# Patient Record
Sex: Female | Born: 1935 | Race: White | Hispanic: No | Smoking: Former smoker
Health system: Southern US, Community
[De-identification: ages and names within clinical notes are randomized; demographics above are authoritative.]

## PROBLEM LIST (undated history)

## (undated) DIAGNOSIS — F419 Anxiety disorder, unspecified: Secondary | ICD-10-CM

## (undated) DIAGNOSIS — E0590 Thyrotoxicosis, unspecified without thyrotoxic crisis or storm: Secondary | ICD-10-CM

## (undated) DIAGNOSIS — I10 Essential (primary) hypertension: Secondary | ICD-10-CM

## (undated) DIAGNOSIS — M1990 Unspecified osteoarthritis, unspecified site: Secondary | ICD-10-CM

## (undated) DIAGNOSIS — M549 Dorsalgia, unspecified: Secondary | ICD-10-CM

## (undated) DIAGNOSIS — I739 Peripheral vascular disease, unspecified: Secondary | ICD-10-CM

## (undated) DIAGNOSIS — C801 Malignant (primary) neoplasm, unspecified: Secondary | ICD-10-CM

## (undated) DIAGNOSIS — J449 Chronic obstructive pulmonary disease, unspecified: Secondary | ICD-10-CM

## (undated) DIAGNOSIS — R42 Dizziness and giddiness: Secondary | ICD-10-CM

## (undated) DIAGNOSIS — R011 Cardiac murmur, unspecified: Secondary | ICD-10-CM

## (undated) HISTORY — PX: MOHS SURGERY: SUR867

## (undated) HISTORY — PX: OTHER SURGICAL HISTORY: SHX169

## (undated) HISTORY — DX: Anxiety disorder, unspecified: F41.9

## (undated) HISTORY — DX: Cardiac murmur, unspecified: R01.1

## (undated) HISTORY — PX: ABDOMINAL HYSTERECTOMY: SHX81

---

## 2003-07-16 ENCOUNTER — Ambulatory Visit (HOSPITAL_COMMUNITY): Admission: RE | Admit: 2003-07-16 | Discharge: 2003-07-16 | Payer: Self-pay | Admitting: Interventional Cardiology

## 2003-10-29 ENCOUNTER — Ambulatory Visit: Payer: Self-pay | Admitting: Internal Medicine

## 2005-01-06 ENCOUNTER — Ambulatory Visit: Payer: Self-pay | Admitting: Internal Medicine

## 2006-04-27 ENCOUNTER — Ambulatory Visit: Payer: Self-pay | Admitting: Internal Medicine

## 2007-04-20 DIAGNOSIS — D039 Melanoma in situ, unspecified: Secondary | ICD-10-CM | POA: Insufficient documentation

## 2007-06-28 ENCOUNTER — Ambulatory Visit: Payer: Self-pay | Admitting: Family Medicine

## 2007-11-02 ENCOUNTER — Ambulatory Visit: Payer: Self-pay | Admitting: Rheumatology

## 2008-08-03 ENCOUNTER — Ambulatory Visit: Payer: Self-pay | Admitting: Family Medicine

## 2008-10-19 ENCOUNTER — Ambulatory Visit: Payer: Self-pay | Admitting: Gynecologic Oncology

## 2008-10-23 ENCOUNTER — Ambulatory Visit: Payer: Self-pay | Admitting: Gynecologic Oncology

## 2008-11-19 ENCOUNTER — Ambulatory Visit: Payer: Self-pay | Admitting: Gynecologic Oncology

## 2008-11-20 ENCOUNTER — Ambulatory Visit: Payer: Self-pay | Admitting: Gynecologic Oncology

## 2008-11-20 ENCOUNTER — Ambulatory Visit: Payer: Self-pay | Admitting: Cardiology

## 2008-11-27 ENCOUNTER — Inpatient Hospital Stay: Payer: Self-pay | Admitting: Gynecologic Oncology

## 2008-12-19 ENCOUNTER — Ambulatory Visit: Payer: Self-pay | Admitting: Gynecologic Oncology

## 2009-01-01 ENCOUNTER — Ambulatory Visit: Payer: Self-pay | Admitting: Gynecologic Oncology

## 2009-01-04 ENCOUNTER — Ambulatory Visit: Payer: Self-pay | Admitting: Gynecologic Oncology

## 2009-01-19 ENCOUNTER — Ambulatory Visit: Payer: Self-pay | Admitting: Gynecologic Oncology

## 2009-01-23 ENCOUNTER — Ambulatory Visit: Payer: Self-pay | Admitting: Radiation Oncology

## 2009-02-19 ENCOUNTER — Ambulatory Visit: Payer: Self-pay | Admitting: Gynecologic Oncology

## 2009-02-19 ENCOUNTER — Ambulatory Visit: Payer: Self-pay | Admitting: Radiation Oncology

## 2009-03-19 ENCOUNTER — Ambulatory Visit: Payer: Self-pay | Admitting: Radiation Oncology

## 2009-03-19 ENCOUNTER — Ambulatory Visit: Payer: Self-pay | Admitting: Gynecologic Oncology

## 2009-04-19 ENCOUNTER — Ambulatory Visit: Payer: Self-pay | Admitting: Radiation Oncology

## 2009-04-19 ENCOUNTER — Ambulatory Visit: Payer: Self-pay | Admitting: Gynecologic Oncology

## 2009-08-19 ENCOUNTER — Ambulatory Visit: Payer: Self-pay | Admitting: Gynecologic Oncology

## 2009-09-17 ENCOUNTER — Ambulatory Visit: Payer: Self-pay | Admitting: Gynecologic Oncology

## 2009-09-19 ENCOUNTER — Ambulatory Visit: Payer: Self-pay | Admitting: Gynecologic Oncology

## 2009-09-19 ENCOUNTER — Ambulatory Visit: Payer: Self-pay | Admitting: Radiation Oncology

## 2009-10-19 ENCOUNTER — Ambulatory Visit: Payer: Self-pay | Admitting: Radiation Oncology

## 2009-12-31 ENCOUNTER — Ambulatory Visit: Payer: Self-pay | Admitting: Gynecologic Oncology

## 2010-01-19 ENCOUNTER — Ambulatory Visit: Payer: Self-pay | Admitting: Gynecologic Oncology

## 2010-05-06 ENCOUNTER — Ambulatory Visit: Payer: Self-pay | Admitting: Gynecologic Oncology

## 2010-05-20 ENCOUNTER — Ambulatory Visit: Payer: Self-pay | Admitting: Gynecologic Oncology

## 2010-09-09 ENCOUNTER — Ambulatory Visit: Payer: Self-pay | Admitting: Gynecologic Oncology

## 2010-09-20 ENCOUNTER — Ambulatory Visit: Payer: Self-pay | Admitting: Gynecologic Oncology

## 2010-12-02 ENCOUNTER — Ambulatory Visit: Payer: Self-pay | Admitting: Gynecologic Oncology

## 2010-12-20 ENCOUNTER — Ambulatory Visit: Payer: Self-pay | Admitting: Gynecologic Oncology

## 2011-02-10 ENCOUNTER — Ambulatory Visit: Payer: Self-pay | Admitting: Radiation Oncology

## 2011-02-12 ENCOUNTER — Ambulatory Visit: Payer: Self-pay | Admitting: Family Medicine

## 2011-02-20 ENCOUNTER — Ambulatory Visit: Payer: Self-pay | Admitting: Radiation Oncology

## 2011-03-17 ENCOUNTER — Ambulatory Visit: Payer: Self-pay | Admitting: Ophthalmology

## 2011-03-17 DIAGNOSIS — Z01810 Encounter for preprocedural cardiovascular examination: Secondary | ICD-10-CM

## 2011-03-30 ENCOUNTER — Ambulatory Visit: Payer: Self-pay | Admitting: Ophthalmology

## 2011-10-13 ENCOUNTER — Ambulatory Visit: Payer: Self-pay | Admitting: Radiation Oncology

## 2011-10-20 ENCOUNTER — Ambulatory Visit: Payer: Self-pay | Admitting: Radiation Oncology

## 2012-03-19 ENCOUNTER — Ambulatory Visit: Payer: Self-pay | Admitting: Gynecologic Oncology

## 2012-04-21 ENCOUNTER — Ambulatory Visit: Payer: Self-pay | Admitting: Family Medicine

## 2012-05-10 ENCOUNTER — Ambulatory Visit: Payer: Self-pay | Admitting: Gynecologic Oncology

## 2012-05-19 ENCOUNTER — Ambulatory Visit: Payer: Self-pay | Admitting: Gynecologic Oncology

## 2012-10-07 ENCOUNTER — Ambulatory Visit: Payer: Self-pay | Admitting: Family Medicine

## 2012-11-08 ENCOUNTER — Ambulatory Visit: Payer: Self-pay | Admitting: Radiation Oncology

## 2012-11-19 ENCOUNTER — Ambulatory Visit: Payer: Self-pay | Admitting: Radiation Oncology

## 2013-06-05 ENCOUNTER — Ambulatory Visit: Payer: Self-pay | Admitting: Gynecologic Oncology

## 2013-06-19 ENCOUNTER — Ambulatory Visit: Payer: Self-pay | Admitting: Gynecologic Oncology

## 2013-10-20 DIAGNOSIS — M1990 Unspecified osteoarthritis, unspecified site: Secondary | ICD-10-CM | POA: Insufficient documentation

## 2013-10-20 DIAGNOSIS — M810 Age-related osteoporosis without current pathological fracture: Secondary | ICD-10-CM | POA: Insufficient documentation

## 2014-05-13 NOTE — Op Note (Signed)
PATIENT NAME:  Kristin Rowe, Kristin Rowe MR#:  [id number] DATE OF BIRTH:  [birth year]  DATE OF PROCEDURE:  03/30/2011  PREOPERATIVE DIAGNOSIS:  Cataract, right eye.   POSTOPERATIVE DIAGNOSIS:  Cataract, right eye.  PROCEDURE PERFORMED:  Extracapsular cataract extraction using phacoemulsification with placement of an Alcon [id number], 22.5-diopter posterior chamber lens, serial # [id number].  SURGEON:  Loura Back. Tersea Aulds, MD  ASSISTANT:  None.  ANESTHESIA:  4% lidocaine and 0.75% Marcaine in a 50/50 mixture with 10 units per mL of Hylenex added, given as peribulbar.  ANESTHESIOLOGIST:  Dr. Ronelle Nigh   COMPLICATIONS:  None.  ESTIMATED BLOOD LOSS:  Less than 1 mL.  DESCRIPTION OF PROCEDURE:  The patient was brought to the operating room and given a peribulbar block.  The patient was then prepped and draped in the usual fashion.  The vertical rectus muscles were imbricated using 5-0 silk sutures.  These sutures were then clamped to the sterile drapes as bridle sutures.  A limbal peritomy was performed extending two clock hours and hemostasis was obtained with cautery.  A partial thickness scleral groove was made at the surgical limbus and dissected anteriorly in a lamellar dissection using an Alcon crescent knife.  The anterior chamber was entered superonasally with a Superblade and through the lamellar dissection with a 2.6 mm keratome.  DisCoVisc was used to replace the aqueous and a continuous tear capsulorrhexis was carried out.  Hydrodissection and hydrodelineation were carried out with balanced salt and a 27 gauge canula.  The nucleus was rotated to confirm the effectiveness of the hydrodissection.  Phacoemulsification was carried out using a divide-and-conquer technique.  Total ultrasound time was 1 minute and 45 seconds with an average power of 22.3 percent.  Irrigation/aspiration was used to remove the residual cortex.  DisCoVisc was used to inflate the capsule and the internal incision was  enlarged to 3 mm with the crescent knife.  The intraocular lens was folded and inserted into the capsular bag using the AcrySert delivery system.  Irrigation/aspiration was used to remove the residual DisCoVisc.  Miostat was injected into the anterior chamber through the paracentesis track to inflate the anterior chamber and induce miosis.  The wound was checked for leaks and none were found. The conjunctiva was closed with cautery and the bridle sutures were removed.  Two drops of 0.3% Vigamox were placed on the eye.   An eye shield was placed on the eye.  The patient was discharged to the recovery room in good condition.  ____________________________ Loura Back Lakenya Riendeau, MD sad:drc D: 03/30/2011 13:33:16 ET T: 03/30/2011 13:44:31 ET JOB#: [id number]  cc: Remo Lipps A. Donni Oglesby, MD, <Dictator> Martie Lee MD ELECTRONICALLY SIGNED 04/06/2011 13:23

## 2014-08-09 ENCOUNTER — Other Ambulatory Visit: Payer: Self-pay | Admitting: Family Medicine

## 2014-08-09 DIAGNOSIS — Z1231 Encounter for screening mammogram for malignant neoplasm of breast: Secondary | ICD-10-CM

## 2014-08-15 ENCOUNTER — Other Ambulatory Visit: Payer: Self-pay | Admitting: Family Medicine

## 2014-08-15 ENCOUNTER — Ambulatory Visit
Admission: RE | Admit: 2014-08-15 | Discharge: 2014-08-15 | Disposition: A | Discharge status: Home / Self Care | Payer: Medicare Other | Source: Ambulatory Visit | Attending: Family Medicine | Admitting: Family Medicine

## 2014-08-15 DIAGNOSIS — Z1231 Encounter for screening mammogram for malignant neoplasm of breast: Secondary | ICD-10-CM

## 2014-08-15 HISTORY — DX: Malignant (primary) neoplasm, unspecified: C80.1

## 2015-08-21 ENCOUNTER — Other Ambulatory Visit: Payer: Self-pay | Admitting: Family Medicine

## 2015-08-21 DIAGNOSIS — R42 Dizziness and giddiness: Secondary | ICD-10-CM

## 2015-08-22 ENCOUNTER — Ambulatory Visit: Payer: Medicare Other

## 2015-08-26 ENCOUNTER — Ambulatory Visit
Admission: RE | Admit: 2015-08-26 | Discharge: 2015-08-26 | Disposition: A | Discharge status: Home / Self Care | Payer: Medicare Other | Source: Ambulatory Visit | Attending: Family Medicine | Admitting: Family Medicine

## 2015-08-26 DIAGNOSIS — R42 Dizziness and giddiness: Secondary | ICD-10-CM | POA: Insufficient documentation

## 2015-08-26 DIAGNOSIS — E041 Nontoxic single thyroid nodule: Secondary | ICD-10-CM | POA: Diagnosis not present

## 2015-09-04 ENCOUNTER — Other Ambulatory Visit: Payer: Self-pay | Admitting: Family Medicine

## 2015-09-04 DIAGNOSIS — E041 Nontoxic single thyroid nodule: Secondary | ICD-10-CM

## 2015-09-04 DIAGNOSIS — I7090 Unspecified atherosclerosis: Secondary | ICD-10-CM

## 2015-09-24 ENCOUNTER — Ambulatory Visit
Admission: RE | Admit: 2015-09-24 | Discharge: 2015-09-24 | Disposition: A | Discharge status: Home / Self Care | Payer: Medicare Other | Source: Ambulatory Visit | Attending: Family Medicine | Admitting: Family Medicine

## 2015-09-24 DIAGNOSIS — E041 Nontoxic single thyroid nodule: Secondary | ICD-10-CM

## 2015-09-24 DIAGNOSIS — I7090 Unspecified atherosclerosis: Secondary | ICD-10-CM

## 2015-09-24 DIAGNOSIS — I672 Cerebral atherosclerosis: Secondary | ICD-10-CM | POA: Insufficient documentation

## 2015-09-24 DIAGNOSIS — I708 Atherosclerosis of other arteries: Secondary | ICD-10-CM | POA: Diagnosis not present

## 2015-09-24 DIAGNOSIS — I6523 Occlusion and stenosis of bilateral carotid arteries: Secondary | ICD-10-CM | POA: Insufficient documentation

## 2015-09-24 DIAGNOSIS — E049 Nontoxic goiter, unspecified: Secondary | ICD-10-CM | POA: Diagnosis not present

## 2015-09-24 DIAGNOSIS — R918 Other nonspecific abnormal finding of lung field: Secondary | ICD-10-CM | POA: Diagnosis not present

## 2015-09-24 DIAGNOSIS — E042 Nontoxic multinodular goiter: Secondary | ICD-10-CM | POA: Insufficient documentation

## 2015-09-24 HISTORY — DX: Essential (primary) hypertension: I10

## 2015-09-24 MED ORDER — IOPAMIDOL (ISOVUE-370) INJECTION 76%
75.0000 mL | Freq: Once | INTRAVENOUS | Status: AC | PRN
  Administered 2015-09-24: 75 mL via INTRAVENOUS

## 2015-09-25 DIAGNOSIS — R42 Dizziness and giddiness: Secondary | ICD-10-CM | POA: Insufficient documentation

## 2015-09-30 DIAGNOSIS — R0789 Other chest pain: Secondary | ICD-10-CM | POA: Insufficient documentation

## 2015-09-30 DIAGNOSIS — I739 Peripheral vascular disease, unspecified: Secondary | ICD-10-CM | POA: Insufficient documentation

## 2015-10-08 ENCOUNTER — Other Ambulatory Visit: Payer: Self-pay | Admitting: Vascular Surgery

## 2015-10-10 ENCOUNTER — Encounter
Admission: RE | Admit: 2015-10-10 | Discharge: 2015-10-10 | Disposition: A | Discharge status: Home / Self Care | Payer: Medicare Other | Source: Ambulatory Visit | Attending: Vascular Surgery | Admitting: Vascular Surgery

## 2015-10-10 DIAGNOSIS — Z01812 Encounter for preprocedural laboratory examination: Secondary | ICD-10-CM | POA: Insufficient documentation

## 2015-10-10 DIAGNOSIS — I779 Disorder of arteries and arterioles, unspecified: Secondary | ICD-10-CM | POA: Diagnosis not present

## 2015-10-10 DIAGNOSIS — Z01818 Encounter for other preprocedural examination: Secondary | ICD-10-CM | POA: Insufficient documentation

## 2015-10-10 HISTORY — DX: Dorsalgia, unspecified: M54.9

## 2015-10-10 HISTORY — DX: Chronic obstructive pulmonary disease, unspecified: J44.9

## 2015-10-10 HISTORY — DX: Dizziness and giddiness: R42

## 2015-10-10 HISTORY — DX: Unspecified osteoarthritis, unspecified site: M19.90

## 2015-10-10 HISTORY — DX: Peripheral vascular disease, unspecified: I73.9

## 2015-10-10 HISTORY — DX: Thyrotoxicosis, unspecified without thyrotoxic crisis or storm: E05.90

## 2015-10-10 LAB — CBC WITH DIFFERENTIAL/PLATELET
BASOS ABS: 0 10*3/uL (ref 0–0.1)
BASOS PCT: 1 %
EOS PCT: 2 %
Eosinophils Absolute: 0.1 10*3/uL (ref 0–0.7)
HCT: 39.7 % (ref 35.0–47.0)
Hemoglobin: 13.3 g/dL (ref 12.0–16.0)
LYMPHS PCT: 19 %
Lymphs Abs: 1.1 10*3/uL (ref 1.0–3.6)
MCH: 29.5 pg (ref 26.0–34.0)
MCHC: 33.4 g/dL (ref 32.0–36.0)
MCV: 88 fL (ref 80.0–100.0)
MONO ABS: 0.6 10*3/uL (ref 0.2–0.9)
Monocytes Relative: 11 %
Neutro Abs: 3.8 10*3/uL (ref 1.4–6.5)
Neutrophils Relative %: 67 %
PLATELETS: 179 10*3/uL (ref 150–440)
RBC: 4.51 MIL/uL (ref 3.80–5.20)
RDW: 14.8 % — AB (ref 11.5–14.5)
WBC: 5.6 10*3/uL (ref 3.6–11.0)

## 2015-10-10 LAB — PROTIME-INR
INR: 0.96
Prothrombin Time: 12.8 seconds (ref 11.4–15.2)

## 2015-10-10 LAB — BASIC METABOLIC PANEL
Anion gap: 6 (ref 5–15)
BUN: 17 mg/dL (ref 6–20)
CALCIUM: 8.8 mg/dL — AB (ref 8.9–10.3)
CO2: 29 mmol/L (ref 22–32)
CREATININE: 0.86 mg/dL (ref 0.44–1.00)
Chloride: 97 mmol/L — ABNORMAL LOW (ref 101–111)
GFR calc Af Amer: >60 mL/min (ref >60)
GFR, EST NON AFRICAN AMERICAN: 59 mL/min — AB (ref >60)
GLUCOSE: 100 mg/dL — AB (ref 65–99)
Potassium: 4.3 mmol/L (ref 3.5–5.1)
Sodium: 132 mmol/L — ABNORMAL LOW (ref 135–145)

## 2015-10-10 LAB — TYPE AND SCREEN
ABO/RH(D): A NEG
Antibody Screen: NEGATIVE

## 2015-10-10 LAB — APTT: APTT: 32 s (ref 24–36)

## 2015-10-10 NOTE — Patient Instructions (Signed)
  Your procedure is scheduled on: 10/17/15 Report to Day Surgery. [facility]OND [facility]LOOR To find out your arrival time please call [phone] between 1PM - 3PM on 10/16/15  Remember: Instructions that are not followed completely may result in serious medical risk, up to and including death, or upon the discretion of your surgeon and anesthesiologist your surgery may need to be rescheduled.    ___X_ 1. Do not eat food or drink liquids after midnight. No gum chewing or hard candies.     ____ 2. No Alcohol for 24 hours before or after surgery.   ____ 3. Do Not Smoke For 24 Hours Prior to Your Surgery.   ____ 4. Bring all medications with you on the day of surgery if instructed.    _X___ 5. Notify your doctor if there is any change in your medical condition     (cold, fever, infections).       Do not wear jewelry, make-up, hairpins, clips or nail polish.  Do not wear lotions, powders, or perfumes. You may wear deodorant.  Do not shave 48 hours prior to surgery. Men may shave face and neck.  Do not bring valuables to the hospital.    [facility] is not responsible for any belongings or valuables.               Contacts, dentures or bridgework may not be worn into surgery.  Leave your suitcase in the car. After surgery it may be brought to your room.  For patients admitted to the hospital, discharge time is determined by your                treatment team.   Patients discharged the day of surgery will not be allowed to drive home.   Please read over the following fact sheets that you were given:   MRSA Information   _X___ Take these medicines the morning of surgery with A SIP OF WATER:    1. METOPROLOL  2. AMLODIPINE  3. LISINOPRIL  4. METHIMAZOLE  5. ALPRAZOLAM  6.  ____ Fleet Enema (as directed)   __X__ Use CHG Soap as directed  ____ Use inhalers on the day of surgery  ____ Stop metformin 2 days prior to surgery    ____ Take 1/2 of usual insulin dose the night  before surgery and none on the morning of surgery.   ____ Stop Coumadin/Plavix/aspirin on        CONTINUE ASPIRIN BUT DO NOT TAKE AM OF SURGERY  ____ Stop Anti-inflammatories on   ____ Stop supplements until after surgery.    ____ Bring C-Pap to the hospital.

## 2015-10-11 NOTE — Pre-Procedure Instructions (Signed)
Met B results sent to Dr. Dew and Anesthesia for review. 

## 2015-10-17 ENCOUNTER — Inpatient Hospital Stay: Payer: Medicare Other | Admitting: Certified Registered"

## 2015-10-17 ENCOUNTER — Inpatient Hospital Stay
Admission: RE | Admit: 2015-10-17 | Discharge: 2015-10-18 | DRG: 039 | Disposition: A | Discharge status: Home / Self Care | Payer: Medicare Other | Source: Ambulatory Visit | Attending: Vascular Surgery | Admitting: Vascular Surgery

## 2015-10-17 ENCOUNTER — Encounter: Payer: Self-pay | Admitting: *Deleted

## 2015-10-17 ENCOUNTER — Encounter
Admission: RE | Disposition: A | Discharge status: Home / Self Care | Payer: Self-pay | Source: Ambulatory Visit | Attending: Vascular Surgery

## 2015-10-17 DIAGNOSIS — Z923 Personal history of irradiation: Secondary | ICD-10-CM

## 2015-10-17 DIAGNOSIS — Z87891 Personal history of nicotine dependence: Secondary | ICD-10-CM | POA: Diagnosis not present

## 2015-10-17 DIAGNOSIS — I6521 Occlusion and stenosis of right carotid artery: Secondary | ICD-10-CM | POA: Diagnosis present

## 2015-10-17 DIAGNOSIS — I739 Peripheral vascular disease, unspecified: Secondary | ICD-10-CM | POA: Diagnosis present

## 2015-10-17 DIAGNOSIS — Z7982 Long term (current) use of aspirin: Secondary | ICD-10-CM | POA: Diagnosis not present

## 2015-10-17 DIAGNOSIS — J449 Chronic obstructive pulmonary disease, unspecified: Secondary | ICD-10-CM | POA: Diagnosis present

## 2015-10-17 DIAGNOSIS — E0590 Thyrotoxicosis, unspecified without thyrotoxic crisis or storm: Secondary | ICD-10-CM | POA: Diagnosis present

## 2015-10-17 DIAGNOSIS — M1990 Unspecified osteoarthritis, unspecified site: Secondary | ICD-10-CM | POA: Diagnosis present

## 2015-10-17 DIAGNOSIS — I6529 Occlusion and stenosis of unspecified carotid artery: Secondary | ICD-10-CM | POA: Diagnosis present

## 2015-10-17 DIAGNOSIS — Z85820 Personal history of malignant melanoma of skin: Secondary | ICD-10-CM | POA: Diagnosis not present

## 2015-10-17 DIAGNOSIS — I10 Essential (primary) hypertension: Secondary | ICD-10-CM | POA: Diagnosis present

## 2015-10-17 HISTORY — PX: ENDARTERECTOMY: SHX5162

## 2015-10-17 LAB — SURGICAL PCR SCREEN
MRSA, PCR: NEGATIVE
Staphylococcus aureus: NEGATIVE

## 2015-10-17 LAB — GLUCOSE, CAPILLARY: Glucose-Capillary: 137 mg/dL — ABNORMAL HIGH (ref 65–99)

## 2015-10-17 LAB — ABO/RH: ABO/RH(D): A NEG

## 2015-10-17 SURGERY — ENDARTERECTOMY, CAROTID
Anesthesia: General | Laterality: Right | Wound class: Clean

## 2015-10-17 MED ORDER — LACTATED RINGERS IV SOLN
INTRAVENOUS | Status: DC | PRN
  Administered 2015-10-17: 13:00:00 via INTRAVENOUS

## 2015-10-17 MED ORDER — LIDOCAINE HCL 1 % IJ SOLN
INTRAMUSCULAR | Status: DC | PRN
  Administered 2015-10-17: 10 mL

## 2015-10-17 MED ORDER — AMLODIPINE BESYLATE 5 MG PO TABS
2.5000 mg | ORAL_TABLET | Freq: Every day | ORAL | Status: DC
  Administered 2015-10-18: 2.5 mg via ORAL
  Filled 2015-10-17: qty 1

## 2015-10-17 MED ORDER — FAMOTIDINE 20 MG PO TABS
20.0000 mg | ORAL_TABLET | Freq: Once | ORAL | Status: AC
  Administered 2015-10-17: 20 mg via ORAL

## 2015-10-17 MED ORDER — CEFAZOLIN SODIUM-DEXTROSE 2-4 GM/100ML-% IV SOLN
2.0000 g | INTRAVENOUS | Status: AC
  Administered 2015-10-17: 2 g via INTRAVENOUS

## 2015-10-17 MED ORDER — LISINOPRIL 20 MG PO TABS
20.0000 mg | ORAL_TABLET | Freq: Every day | ORAL | Status: DC
Start: 2015-10-18 — End: 2015-10-18
  Administered 2015-10-18: 20 mg via ORAL
  Filled 2015-10-17: qty 1

## 2015-10-17 MED ORDER — ONDANSETRON HCL 4 MG/2ML IJ SOLN
INTRAMUSCULAR | Status: DC | PRN
  Administered 2015-10-17: 4 mg via INTRAVENOUS

## 2015-10-17 MED ORDER — ESMOLOL HCL-SODIUM CHLORIDE 2000 MG/100ML IV SOLN: 25.0000 ug/kg/min | INTRAVENOUS | Status: DC

## 2015-10-17 MED ORDER — NITROGLYCERIN IN D5W 200-5 MCG/ML-% IV SOLN
INTRAVENOUS | Status: AC
  Filled 2015-10-17: qty 250

## 2015-10-17 MED ORDER — MAGNESIUM SULFATE 2 GM/50ML IV SOLN
2.0000 g | Freq: Every day | INTRAVENOUS | Status: DC | PRN
  Filled 2015-10-17: qty 50

## 2015-10-17 MED ORDER — DOCUSATE SODIUM 100 MG PO CAPS
100.0000 mg | ORAL_CAPSULE | Freq: Every day | ORAL | Status: DC
  Filled 2015-10-17: qty 1

## 2015-10-17 MED ORDER — PHENOL 1.4 % MT LIQD
1.0000 | OROMUCOSAL | Status: DC | PRN
  Filled 2015-10-17: qty 177

## 2015-10-17 MED ORDER — ACETAMINOPHEN 325 MG PO TABS: 325.0000 mg | ORAL_TABLET | ORAL | Status: DC | PRN

## 2015-10-17 MED ORDER — EVICEL 2 ML EX KIT
PACK | CUTANEOUS | Status: AC
  Filled 2015-10-17: qty 1

## 2015-10-17 MED ORDER — METOPROLOL TARTRATE 5 MG/5ML IV SOLN: 2.0000 mg | INTRAVENOUS | Status: DC | PRN

## 2015-10-17 MED ORDER — ONDANSETRON HCL 4 MG/2ML IJ SOLN: 4.0000 mg | Freq: Once | INTRAMUSCULAR | Status: DC | PRN

## 2015-10-17 MED ORDER — CHLORHEXIDINE GLUCONATE CLOTH 2 % EX PADS
6.0000 | MEDICATED_PAD | Freq: Once | CUTANEOUS | Status: DC
Start: 2015-10-17 — End: 2015-10-17

## 2015-10-17 MED ORDER — PHENYLEPHRINE HCL 10 MG/ML IJ SOLN
INTRAMUSCULAR | Status: DC | PRN
  Administered 2015-10-17 (×2): 100 ug via INTRAVENOUS

## 2015-10-17 MED ORDER — DOPAMINE-DEXTROSE 3.2-5 MG/ML-% IV SOLN: 3.0000 ug/kg/min | INTRAVENOUS | Status: DC

## 2015-10-17 MED ORDER — FENTANYL CITRATE (PF) 100 MCG/2ML IJ SOLN
25.0000 ug | INTRAMUSCULAR | Status: DC | PRN
  Administered 2015-10-17 (×4): 25 ug via INTRAVENOUS

## 2015-10-17 MED ORDER — LABETALOL HCL 5 MG/ML IV SOLN
INTRAVENOUS | Status: AC
  Filled 2015-10-17: qty 4

## 2015-10-17 MED ORDER — SODIUM CHLORIDE 0.9 % IV SOLN
INTRAVENOUS | Status: DC
  Administered 2015-10-17: 11:00:00 via INTRAVENOUS

## 2015-10-17 MED ORDER — HEPARIN SODIUM (PORCINE) 1000 UNIT/ML IJ SOLN
INTRAMUSCULAR | Status: DC | PRN
  Administered 2015-10-17: 5000 [IU] via INTRAVENOUS

## 2015-10-17 MED ORDER — ASPIRIN EC 81 MG PO TBEC
81.0000 mg | DELAYED_RELEASE_TABLET | Freq: Every day | ORAL | Status: DC
  Administered 2015-10-18: 81 mg via ORAL
  Filled 2015-10-17: qty 1

## 2015-10-17 MED ORDER — LIDOCAINE HCL (CARDIAC) 20 MG/ML IV SOLN
INTRAVENOUS | Status: DC | PRN
Start: 2015-10-17 — End: 2015-10-17
  Administered 2015-10-17: 100 mg via INTRAVENOUS

## 2015-10-17 MED ORDER — LIDOCAINE HCL (PF) 1 % IJ SOLN
INTRAMUSCULAR | Status: AC
  Filled 2015-10-17: qty 30

## 2015-10-17 MED ORDER — ROCURONIUM BROMIDE 100 MG/10ML IV SOLN
INTRAVENOUS | Status: DC | PRN
  Administered 2015-10-17: 50 mg via INTRAVENOUS
  Administered 2015-10-17: 20 mg via INTRAVENOUS

## 2015-10-17 MED ORDER — NITROGLYCERIN IN D5W 200-5 MCG/ML-% IV SOLN: 5.0000 ug/min | INTRAVENOUS | Status: DC

## 2015-10-17 MED ORDER — SODIUM CHLORIDE 0.9 % IV SOLN
INTRAVENOUS | Status: DC | PRN
  Administered 2015-10-17: 100 ug/min via INTRAVENOUS

## 2015-10-17 MED ORDER — CEFAZOLIN SODIUM-DEXTROSE 2-4 GM/100ML-% IV SOLN
INTRAVENOUS | Status: AC
  Filled 2015-10-17: qty 100

## 2015-10-17 MED ORDER — COD LIVER OIL 1000 MG PO CAPS
ORAL_CAPSULE | ORAL | Status: DC
Start: 2015-10-17 — End: 2015-10-17

## 2015-10-17 MED ORDER — GUAIFENESIN-DM 100-10 MG/5ML PO SYRP: 15.0000 mL | ORAL_SOLUTION | ORAL | Status: DC | PRN

## 2015-10-17 MED ORDER — CLOPIDOGREL BISULFATE 75 MG PO TABS
75.0000 mg | ORAL_TABLET | Freq: Every day | ORAL | Status: DC
  Administered 2015-10-18: 75 mg via ORAL
  Filled 2015-10-17: qty 1

## 2015-10-17 MED ORDER — ACETAMINOPHEN 650 MG RE SUPP: 325.0000 mg | RECTAL | Status: DC | PRN

## 2015-10-17 MED ORDER — ONDANSETRON HCL 4 MG/2ML IJ SOLN
4.0000 mg | Freq: Four times a day (QID) | INTRAMUSCULAR | Status: DC | PRN
  Administered 2015-10-17 (×2): 4 mg via INTRAVENOUS

## 2015-10-17 MED ORDER — FAMOTIDINE IN NACL 20-0.9 MG/50ML-% IV SOLN
20.0000 mg | Freq: Two times a day (BID) | INTRAVENOUS | Status: DC
  Administered 2015-10-17 – 2015-10-18 (×2): 20 mg via INTRAVENOUS
  Filled 2015-10-17 (×2): qty 50

## 2015-10-17 MED ORDER — HYDRALAZINE HCL 20 MG/ML IJ SOLN
5.0000 mg | INTRAMUSCULAR | Status: DC | PRN
  Filled 2015-10-17: qty 0.25

## 2015-10-17 MED ORDER — SODIUM CHLORIDE 0.9 % IV SOLN
INTRAVENOUS | Status: DC | PRN
  Administered 2015-10-17: 50 mL via INTRAMUSCULAR

## 2015-10-17 MED ORDER — METHIMAZOLE 10 MG PO TABS
5.0000 mg | ORAL_TABLET | Freq: Every day | ORAL | Status: DC
  Administered 2015-10-18: 5 mg via ORAL
  Filled 2015-10-17: qty 1

## 2015-10-17 MED ORDER — EVICEL 2 ML EX KIT
PACK | CUTANEOUS | Status: DC | PRN
  Administered 2015-10-17: 2 mL

## 2015-10-17 MED ORDER — FAMOTIDINE 20 MG PO TABS
ORAL_TABLET | ORAL | Status: AC
  Filled 2015-10-17: qty 1

## 2015-10-17 MED ORDER — SUGAMMADEX SODIUM 200 MG/2ML IV SOLN
INTRAVENOUS | Status: DC | PRN
  Administered 2015-10-17: 140 mg via INTRAVENOUS

## 2015-10-17 MED ORDER — ONDANSETRON HCL 4 MG/2ML IJ SOLN
INTRAMUSCULAR | Status: AC
  Administered 2015-10-17: 4 mg via INTRAVENOUS
  Filled 2015-10-17: qty 2

## 2015-10-17 MED ORDER — ALUM & MAG HYDROXIDE-SIMETH 200-200-20 MG/5ML PO SUSP: 15.0000 mL | ORAL | Status: DC | PRN

## 2015-10-17 MED ORDER — DEXTROSE 5 % IV SOLN
1.5000 g | Freq: Two times a day (BID) | INTRAVENOUS | Status: DC
  Administered 2015-10-18: 1.5 g via INTRAVENOUS
  Filled 2015-10-17 (×2): qty 1.5

## 2015-10-17 MED ORDER — SODIUM CHLORIDE FLUSH 0.9 % IV SOLN
INTRAVENOUS | Status: AC
  Filled 2015-10-17: qty 10

## 2015-10-17 MED ORDER — FENTANYL CITRATE (PF) 100 MCG/2ML IJ SOLN
INTRAMUSCULAR | Status: AC
  Administered 2015-10-17: 25 ug via INTRAVENOUS
  Filled 2015-10-17: qty 2

## 2015-10-17 MED ORDER — CHLORHEXIDINE GLUCONATE CLOTH 2 % EX PADS: 6.0000 | MEDICATED_PAD | Freq: Once | CUTANEOUS | Status: DC

## 2015-10-17 MED ORDER — PROPOFOL 10 MG/ML IV BOLUS
INTRAVENOUS | Status: DC | PRN
  Administered 2015-10-17: 150 mg via INTRAVENOUS

## 2015-10-17 MED ORDER — CEFAZOLIN SODIUM 1 G IJ SOLR
INTRAMUSCULAR | Status: AC
  Filled 2015-10-17: qty 10

## 2015-10-17 MED ORDER — DEXAMETHASONE SODIUM PHOSPHATE 10 MG/ML IJ SOLN
INTRAMUSCULAR | Status: DC | PRN
  Administered 2015-10-17: 5 mg via INTRAVENOUS

## 2015-10-17 MED ORDER — FENTANYL CITRATE (PF) 100 MCG/2ML IJ SOLN
INTRAMUSCULAR | Status: DC | PRN
  Administered 2015-10-17 (×2): 50 ug via INTRAVENOUS

## 2015-10-17 MED ORDER — LABETALOL HCL 5 MG/ML IV SOLN
10.0000 mg | INTRAVENOUS | Status: DC | PRN
  Administered 2015-10-17: 10 mg via INTRAVENOUS
  Filled 2015-10-17 (×3): qty 4

## 2015-10-17 MED ORDER — SODIUM CHLORIDE 0.9 % IV SOLN
INTRAVENOUS | Status: DC
  Administered 2015-10-18: 05:00:00 via INTRAVENOUS

## 2015-10-17 MED ORDER — HEPARIN SODIUM (PORCINE) 1000 UNIT/ML IJ SOLN
INTRAMUSCULAR | Status: AC
  Filled 2015-10-17: qty 1

## 2015-10-17 MED ORDER — SODIUM CHLORIDE 0.9 % IV SOLN: 500.0000 mL | Freq: Once | INTRAVENOUS | Status: DC | PRN

## 2015-10-17 MED ORDER — OXYCODONE-ACETAMINOPHEN 5-325 MG PO TABS: 1.0000 | ORAL_TABLET | ORAL | Status: DC | PRN

## 2015-10-17 MED ORDER — METOPROLOL TARTRATE 50 MG PO TABS
100.0000 mg | ORAL_TABLET | Freq: Two times a day (BID) | ORAL | Status: DC
  Administered 2015-10-18: 100 mg via ORAL
  Filled 2015-10-17: qty 2

## 2015-10-17 MED ORDER — ALPRAZOLAM 0.25 MG PO TABS: 0.2500 mg | ORAL_TABLET | Freq: Three times a day (TID) | ORAL | Status: DC | PRN

## 2015-10-17 MED ORDER — POTASSIUM CHLORIDE CRYS ER 20 MEQ PO TBCR: 20.0000 meq | EXTENDED_RELEASE_TABLET | Freq: Every day | ORAL | Status: DC | PRN

## 2015-10-17 SURGICAL SUPPLY — 65 items
BAG DECANTER FOR FLEXI CONT (MISCELLANEOUS) ×3 IMPLANT
BLADE SURG 15 STRL LF DISP TIS (BLADE) ×1 IMPLANT
BLADE SURG 15 STRL SS (BLADE) ×3
BLADE SURG SZ11 CARB STEEL (BLADE) ×3 IMPLANT
BOOT SUTURE AID YELLOW STND (SUTURE) ×3 IMPLANT
BRUSH SCRUB 4% CHG (MISCELLANEOUS) ×3 IMPLANT
CANISTER SUCT 1200ML W/VALVE (MISCELLANEOUS) ×3 IMPLANT
CATH TRAY 16F METER LATEX (MISCELLANEOUS) ×3 IMPLANT
DRAPE INCISE IOBAN 66X45 STRL (DRAPES) ×3 IMPLANT
DRAPE LAPAROTOMY 77X122 PED (DRAPES) ×3 IMPLANT
DRAPE SHEET LG 3/4 BI-LAMINATE (DRAPES) ×3 IMPLANT
DRSG TEGADERM 4X4.75 (GAUZE/BANDAGES/DRESSINGS) IMPLANT
DRSG TELFA 3X8 NADH (GAUZE/BANDAGES/DRESSINGS) IMPLANT
DURAPREP 26ML APPLICATOR (WOUND CARE) ×3 IMPLANT
ELECT CAUTERY BLADE 6.4 (BLADE) ×3 IMPLANT
ELECT REM PT RETURN 9FT ADLT (ELECTROSURGICAL) ×3
ELECTRODE REM PT RTRN 9FT ADLT (ELECTROSURGICAL) ×1 IMPLANT
EVICEL 2ML SEALANT HUMAN (Miscellaneous) ×2 IMPLANT
GLOVE BIO SURGEON STRL SZ7 (GLOVE) ×9 IMPLANT
GLOVE INDICATOR 7.5 STRL GRN (GLOVE) ×3 IMPLANT
GOWN STRL REUS W/ TWL LRG LVL3 (GOWN DISPOSABLE) ×2 IMPLANT
GOWN STRL REUS W/ TWL XL LVL3 (GOWN DISPOSABLE) ×2 IMPLANT
GOWN STRL REUS W/TWL LRG LVL3 (GOWN DISPOSABLE) ×6
GOWN STRL REUS W/TWL XL LVL3 (GOWN DISPOSABLE) ×6
HEMOSTAT SURGICEL 2X3 (HEMOSTASIS) ×3 IMPLANT
IV NS 250ML (IV SOLUTION) ×3
IV NS 250ML BAXH (IV SOLUTION) ×1 IMPLANT
KIT RM TURNOVER STRD PROC AR (KITS) ×3 IMPLANT
LABEL OR SOLS (LABEL) ×3 IMPLANT
LIQUID BAND (GAUZE/BANDAGES/DRESSINGS) ×3 IMPLANT
LOOP RED MAXI  1X406MM (MISCELLANEOUS) ×4
LOOP VESSEL MAXI 1X406 RED (MISCELLANEOUS) ×2 IMPLANT
LOOP VESSEL MINI 0.8X406 BLUE (MISCELLANEOUS) ×1 IMPLANT
LOOPS BLUE MINI 0.8X406MM (MISCELLANEOUS) ×2
MICROPUNCTURE 5FR NT-U-SST (MISCELLANEOUS) ×3
NDL FILTER BLUNT 18X1 1/2 (NEEDLE) ×1 IMPLANT
NDL HYPO 25X1 1.5 SAFETY (NEEDLE) ×1 IMPLANT
NEEDLE FILTER BLUNT 18X 1/2SAF (NEEDLE) ×2
NEEDLE FILTER BLUNT 18X1 1/2 (NEEDLE) ×1 IMPLANT
NEEDLE HYPO 25X1 1.5 SAFETY (NEEDLE) ×3 IMPLANT
NS IRRIG 1000ML POUR BTL (IV SOLUTION) ×3 IMPLANT
PACK BASIN MAJOR ARMC (MISCELLANEOUS) ×3 IMPLANT
PAD DRESSING TELFA 3X8 NADH (GAUZE/BANDAGES/DRESSINGS) IMPLANT
PATCH CAROTID ECM VASC 1X10 (Prosthesis & Implant Heart) ×3 IMPLANT
PENCIL ELECTRO HAND CTR (MISCELLANEOUS) IMPLANT
SET MICROPUNCTURE 5FR NT-U-SST (MISCELLANEOUS) IMPLANT
SHUNT W TPORT 9FR PRUITT F3 (SHUNT) ×3 IMPLANT
SUT MNCRL 4-0 (SUTURE) ×3
SUT MNCRL 4-0 27XMFL (SUTURE) ×1
SUT PROLENE 6 0 BV (SUTURE) ×12 IMPLANT
SUT PROLENE 7 0 BV 1 (SUTURE) ×6 IMPLANT
SUT SILK 2 0 (SUTURE) ×3
SUT SILK 2-0 18XBRD TIE 12 (SUTURE) ×1 IMPLANT
SUT SILK 3 0 (SUTURE) ×3
SUT SILK 3-0 18XBRD TIE 12 (SUTURE) ×1 IMPLANT
SUT SILK 4 0 (SUTURE) ×3
SUT SILK 4-0 18XBRD TIE 12 (SUTURE) ×1 IMPLANT
SUT VIC AB 3-0 SH 27 (SUTURE) ×6
SUT VIC AB 3-0 SH 27X BRD (SUTURE) ×2 IMPLANT
SUTURE MNCRL 4-0 27XMF (SUTURE) ×1 IMPLANT
SYR 20CC LL (SYRINGE) ×3 IMPLANT
SYRINGE 10CC LL (SYRINGE) ×6 IMPLANT
TOWEL OR 17X26 4PK STRL BLUE (TOWEL DISPOSABLE) ×1 IMPLANT
TUBING CONNECTING 10 (TUBING) IMPLANT
TUBING CONNECTING 10' (TUBING)

## 2015-10-17 NOTE — Anesthesia Preprocedure Evaluation (Signed)
Anesthesia Evaluation  Patient identified by MRN, date of birth, ID band Patient awake    Reviewed: Allergy & Precautions, NPO status , Patient's Chart, lab work & pertinent test results  History of Anesthesia Complications Negative for: history of anesthetic complications  Airway Mallampati: II       Dental  (+) Partial Lower, Partial Upper   Pulmonary COPD, former smoker,           Cardiovascular hypertension, Pt. on medications and Pt. on home beta blockers + Peripheral Vascular Disease       Neuro/Psych negative neurological ROS     GI/Hepatic negative GI ROS, Neg liver ROS,   Endo/Other  Hyperthyroidism   Renal/GU negative Renal ROS     Musculoskeletal  (+) Arthritis , Osteoarthritis,    Abdominal   Peds  Hematology negative hematology ROS (+)   Anesthesia Other Findings   Reproductive/Obstetrics                             Anesthesia Physical Anesthesia Plan  ASA: III  Anesthesia Plan: General   Post-op Pain Management:    Induction: Intravenous  Airway Management Planned: Oral ETT  Additional Equipment:   Intra-op Plan:   Post-operative Plan:   Informed Consent: I have reviewed the patients History and Physical, chart, labs and discussed the procedure including the risks, benefits and alternatives for the proposed anesthesia with the patient or authorized representative who has indicated his/her understanding and acceptance.     Plan Discussed with:   Anesthesia Plan Comments:         Anesthesia Quick Evaluation

## 2015-10-17 NOTE — Op Note (Signed)
[facility]   OPERATIVE NOTE  DATE: 10/17/2015  PRE-OPERATIVE DIAGNOSIS: carotid stenosis, need for invasive hemodynamic monitoring during surgery  POST-OPERATIVE DIAGNOSIS: Same as above  PROCEDURE: 1.   right radial arterial line placement with ultrasound guidance  SURGEON: Lyndon Chapel  ASSISTANT(S): None  ANESTHESIA: None  ESTIMATED BLOOD LOSS: minimal  FINDING(S): 1.  none  SPECIMEN(S):  None  INDICATIONS:   Patient is a 82 y.o.female who presents with carotid stenosis and is here for planned carotid endarterectomy. We are placing the arterial line for invasive hemodynamic monitoring and arterial access for blood draws and ABGs.  DESCRIPTION: The patient's right wrist was prepped and draped. The right radial artery was visualized with ultrasound. This was found to be patent. It was then accessed with minimal difficulty with a micropuncture needle and a permanent image was recorded. A micropuncture wire was then placed. A 5 French micropuncture sheath was then placed over the wire and the wire and dilator were removed. This was secured and connected to the appropriate monitors where a brisk arterial tracing was seen. The patient tolerated the procedure well without complications.   COMPLICATIONS: None  CONDITION: Stable   Leotis Pain, MD  10/17/2015 3:09 PM

## 2015-10-17 NOTE — H&P (Signed)
[facility] History & Physical Update  The patient was interviewed and re-examined.  The patient's previous History and Physical has been reviewed and is unchanged.  There is no change in the plan of care. We plan to proceed with the scheduled procedure.  DEW,JASON, MD  10/17/2015, 12:08 PM

## 2015-10-17 NOTE — Anesthesia Procedure Notes (Signed)
Procedure Name: Intubation Date/Time: 10/17/2015 12:48 PM Performed by: Silvana Newness Pre-anesthesia Checklist: Patient identified, Emergency Drugs available, Suction available, Patient being monitored and Timeout performed Patient Re-evaluated:Patient Re-evaluated prior to inductionOxygen Delivery Method: Circle system utilized Preoxygenation: Pre-oxygenation with 100% oxygen Intubation Type: IV induction Ventilation: Mask ventilation without difficulty Laryngoscope Size: Mac and 3 Grade View: Grade I Tube type: Oral Tube size: 7.0 mm Number of attempts: 1 Airway Equipment and Method: Rigid stylet Placement Confirmation: ETT inserted through vocal cords under direct vision,  positive ETCO2 and breath sounds checked- equal and bilateral Secured at: 21 cm Tube secured with: Tape Dental Injury: Teeth and Oropharynx as per pre-operative assessment

## 2015-10-17 NOTE — Transfer of Care (Signed)
Immediate Anesthesia Transfer of Care Note  Patient: Kristin Rowe  Procedure(s) Performed: Procedure(s): ENDARTERECTOMY CAROTID (Right)  Patient Location: PACU  Anesthesia Type:General  Level of Consciousness: awake, alert , oriented and patient cooperative  Airway & Oxygen Therapy: Patient Spontanous Breathing and Patient connected to face mask oxygen  Post-op Assessment: Report given to RN, Post -op Vital signs reviewed and stable and Patient moving all extremities X 4  Post vital signs: Reviewed and stable  Last Vitals:  Vitals:   10/17/15 1032  BP: (!) 148/66  Pulse: (!) 57  Resp: 16  Temp: 36.6 C    Last Pain:  Vitals:   10/17/15 1032  TempSrc: Oral         Complications: No apparent anesthesia complications

## 2015-10-17 NOTE — Op Note (Signed)
[facility]   OPERATIVE NOTE  PROCEDURE:   1.  right carotid endarterectomy with CorMatrix arterial patch reconstruction  PRE-OPERATIVE DIAGNOSIS: 1.  High grade right carotid stenosis  POST-OPERATIVE DIAGNOSIS: same as above   SURGEON: Leotis Pain, MD  ASSISTANT(S): Hezzie Bump, PA-C  ANESTHESIA: general  ESTIMATED BLOOD LOSS: 25 cc  FINDING(S): 1.  right carotid plaque.  SPECIMEN(S):  Carotid plaque (sent to Pathology)  INDICATIONS:   Kristin Rowe is a 82 y.o. female who presents with right carotid stenosis of 80%.  I discussed with the patient the risks, benefits, and alternatives to carotid endarterectomy.  I discussed the differences between carotid stenting and carotid endarterectomy. I discussed the procedural details of carotid endarterectomy with the patient.  The patient is aware that the risks of carotid endarterectomy include but are not limited to: bleeding, infection, stroke, myocardial infarction, death, cranial nerve injuries both temporary and permanent, neck hematoma, possible airway compromise, labile blood pressure post-operatively, cerebral hyperperfusion syndrome, and possible need for additional interventions in the future. The patient is aware of the risks and agrees to proceed forward with the procedure.  DESCRIPTION: After full informed written consent was obtained from the patient, the patient was brought back to the operating room and placed supine upon the operating table.  Prior to induction, the patient received IV antibiotics.  After obtaining adequate anesthesia, the patient was placed into a modified beach chair position with a shoulder roll in place and the patient's neck slightly hyperextended and rotated away from the surgical site.  The patient was prepped in the standard fashion for a carotid endarterectomy.  I made an incision anterior to the sternocleidomastoid muscle and dissected down through the subcutaneous tissue.   The platysmas was opened with electrocautery.  Then I dissected down to the internal jugular vein and facial vein.  The facial vein is ligated and divided between 2-0 silk ties.  This was dissected posteriorly until I obtained visualization of the common carotid artery.  This was dissected out and then a vessel loop was placed around the common carotid artery.  I then dissected in a periadventitial fashion along the common carotid artery up to the bifurcation.  I then identified the external carotid artery and the superior thyroid artery.  I placed a vessel loop around the superior thyroid artery, and I also dissected out the external carotid artery and placed a vessel loop around it. In the process of this dissection, the hypoglossal nerve was identified and protected from harm.  I then dissected out the internal carotid artery until I identified an area in the internal carotid artery clearly above the stenosis.  I dissected slightly distal to this area, and placed a vessel loop around the artery.  At this point, we gave the patient 5000 units of intravenous heparin.  After this was allowed to circulate for several minutes, I pulled up control on the vessel loops to clamp the internal carotid artery, external carotid artery, superior thyroid artery, and then the common carotid artery.  I then made an arteriotomy in the common carotid artery with a 11 blade, and extended the arteriotomy with a Potts scissor down into the common carotid artery, then I carried the arteriotomy through the bifurcation into the internal carotid artery until I reached an area that was not diseased.  At this point, I took the Pruitt-Inahara shunt that previously been prepared and I inserted it into the internal carotid artery first, and then into the common  carotid artery taking care to flush and de-air prior to release of control. At this point, I started the endarterectomy in the common carotid artery with a Penfield elevator and carried  this dissection down into the common carotid artery circumferentially.  Then I transected the plaque at a segment where it was adherent and transected the plaque with Potts scissors.  I then carried this dissection up into the external carotid artery.  The plaque was extracted by unclamping the external carotid artery and performing an eversion endarterectomy.  The dissection was then carried into the internal carotid artery where a nice feathered end point was created with gentle traction.  I passed the plaque off the field as a specimen. At this point I removed all loose flecks and remaining disease possible.  At this point, I was satisfied that the minimal remaining disease was densely adherent to the wall and wall integrity was intact. The distal endpoint was tacked down with two 7-0 Prolene sutures.  I then fashioned a CorMatrix arterial patch for the artery and sewed it in place with two running stitch of 6-0 Prolene.  I started at the distal endpoint and ran one half the length of the arteriotomy.  I then cut and beveled the patch to an appropriate length to match the arteriotomy.  I started the second 6-0 Prolene at the proximal end point.  The medial suture line was completed and the lateral suture line was run approximately one quarter the length of the arteriotomy.  Prior to completing this patch angioplasty, I removed the shunt first from the internal carotid artery, from which there was excellent backbleeding, and clamped it.  Then I removed the shunt from the common carotid artery, from which there was excellent antegrade bleeding, and then clamped it.  At this point, I allowed the external carotid artery to backbleed, which was excellent.  Then I instilled heparinized saline in this patched artery and then completed the patch angioplasty in the usual fashion.  First, I released the clamp on the external carotid artery, then I released it on the common carotid artery.  After waiting a few seconds, I  then released it on the internal carotid artery. Several minutes of pressure were held and 6-0 Prolene patch sutures were used as need for hemostasis.  At this point, I placed Surgicel and Evicel topical hemostatic agents.  There was no more active bleeding in the surgical site.  The sternocleidomastoid space was closed with three interrupted 3-0 Vicryl sutures. I then reapproximated the platysma muscle with a running stitch of 3-0 Vicryl.  The skin was then closed with a running subcuticular 4-0 Monocryl.  The skin was then cleaned, dried and Dermabond was used to reinforce the skin closure.  The patient awakened and was taken to the recovery room in stable condition, following commands and moving all four extremities without any apparent deficits.    COMPLICATIONS: none  CONDITION: stable  Grete Bosko  10/17/2015, 3:03 PM

## 2015-10-18 ENCOUNTER — Encounter: Payer: Self-pay | Admitting: Vascular Surgery

## 2015-10-18 LAB — BASIC METABOLIC PANEL
Anion gap: 5 (ref 5–15)
BUN: 14 mg/dL (ref 6–20)
CHLORIDE: 105 mmol/L (ref 101–111)
CO2: 26 mmol/L (ref 22–32)
CREATININE: 0.61 mg/dL (ref 0.44–1.00)
Calcium: 8.3 mg/dL — ABNORMAL LOW (ref 8.9–10.3)
GFR calc Af Amer: >60 mL/min (ref >60)
GFR calc non Af Amer: >60 mL/min (ref >60)
Glucose, Bld: 144 mg/dL — ABNORMAL HIGH (ref 65–99)
Potassium: 4.4 mmol/L (ref 3.5–5.1)
SODIUM: 136 mmol/L (ref 135–145)

## 2015-10-18 LAB — CBC
HEMATOCRIT: 35.2 % (ref 35.0–47.0)
HEMOGLOBIN: 12 g/dL (ref 12.0–16.0)
MCH: 30 pg (ref 26.0–34.0)
MCHC: 33.9 g/dL (ref 32.0–36.0)
MCV: 88.3 fL (ref 80.0–100.0)
Platelets: 140 10*3/uL — ABNORMAL LOW (ref 150–440)
RBC: 3.99 MIL/uL (ref 3.80–5.20)
RDW: 14.7 % — AB (ref 11.5–14.5)
WBC: 8.6 10*3/uL (ref 3.6–11.0)

## 2015-10-18 MED ORDER — CLOPIDOGREL BISULFATE 75 MG PO TABS: 75.0000 mg | ORAL_TABLET | Freq: Every day | ORAL | 5 refills | Status: DC

## 2015-10-18 MED ORDER — TRAMADOL HCL 50 MG PO TABS: 50.0000 mg | ORAL_TABLET | Freq: Four times a day (QID) | ORAL | 0 refills | Status: DC | PRN

## 2015-10-18 NOTE — Progress Notes (Signed)
[facility]  Daily Progress Note   Subjective  - 1 Day Post-Op  Feels well this am Stable neck bruising and mild swelling, no enlarging hematoma  Objective Vitals:   10/18/15 0500 10/18/15 0600 10/18/15 0700 10/18/15 0800  BP: 128/65 (!) 127/57 128/60 133/65  Pulse: 64 74 72 72  Resp: 18 18 18 16   Temp:      TempSrc:      SpO2: 99% 93% 95% 98%  Weight:      Height:        Intake/Output Summary (Last 24 hours) at 10/18/15 0858 Last data filed at 10/18/15 0700  Gross per 24 hour  Intake             2450 ml  Output             1090 ml  Net             1360 ml    PULM  CTAB CV  RRR VASC  Neck as above. Neuro exam normal  Laboratory CBC    Component Value Date/Time   WBC 8.6 10/18/2015 0530   HGB 12.0 10/18/2015 0530   HCT 35.2 10/18/2015 0530   PLT 140 (L) 10/18/2015 0530    BMET    Component Value Date/Time   NA 136 10/18/2015 0530   K 4.4 10/18/2015 0530   CL 105 10/18/2015 0530   CO2 26 10/18/2015 0530   GLUCOSE 144 (H) 10/18/2015 0530   BUN 14 10/18/2015 0530   CREATININE 0.61 10/18/2015 0530   CALCIUM 8.3 (L) 10/18/2015 0530   GFRNONAA >60 10/18/2015 0530   GFRAA >60 10/18/2015 0530    Assessment/Planning: POD #1 s/p right CEA   Doing well  Advance diet  SL IV  OOB  Likely discharge home later today    Kristin Rowe  10/18/2015, 8:58 AM

## 2015-10-18 NOTE — Progress Notes (Signed)
Dr. Lucky Cowboy at bedside.  Talking with patient.  Pt will be discharged this afternoon.

## 2015-10-18 NOTE — Discharge Instructions (Signed)
You may shower as of Sunday. Keep incision clean and dry. No driving until you follow up for your first post-operative visit in our clinic.

## 2015-10-18 NOTE — Progress Notes (Signed)
Pt care assumed.  Pt resting in bed, NAD  Noted.

## 2015-10-18 NOTE — Anesthesia Postprocedure Evaluation (Signed)
Anesthesia Post Note  Patient: ONISHA MENDOSA  Procedure(s) Performed: Procedure(s) (LRB): ENDARTERECTOMY CAROTID (Right)  Patient location during evaluation: ICU Anesthesia Type: General Level of consciousness: awake and alert and oriented Pain management: satisfactory to patient Vital Signs Assessment: post-procedure vital signs reviewed and stable Respiratory status: spontaneous breathing, nonlabored ventilation and respiratory function stable Cardiovascular status: stable Postop Assessment: no signs of nausea or vomiting and adequate PO intake Anesthetic complications: no    Last Vitals:  Vitals:   10/18/15 0600 10/18/15 0700  BP: (!) 127/57 128/60  Pulse: 74 72  Resp: 18 18  Temp:      Last Pain:  Vitals:   10/18/15 0400  TempSrc: Oral  PainSc:                  Darlyne Russian

## 2015-10-18 NOTE — Progress Notes (Signed)
Pt discharged at this time.  Iv's discontinued, catheders intact.  Sites covered with clean dry dressing and wrapped with Coban.  Pt denies further need.  Daughters given discharge instructions as well as RX's for Tramadol and Plavix.  They verbalized understanding of medications.  Denies questions.  Daughters dressed patient and are pushing her out via wheelchair.  Pt and family thankful for care.

## 2015-10-18 NOTE — Discharge Summary (Signed)
[facility]    Discharge Summary  Patient ID:  Kristin Rowe MRN: [id number] DOB/AGE: [birth year] 82 y.o.  Admit date: 10/17/2015 Discharge date: 10/18/2015 Date of Surgery: 10/17/2015 Surgeon: Surgeon(s): Algernon Huxley, MD  Admission Diagnosis: CAROTIE ARTERY STENOSIS  Discharge Diagnoses:  CAROTIE ARTERY STENOSIS  Secondary Diagnoses: Past Medical History:  Diagnosis Date  . Arthritis   . Back pain    chronic  . 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 COPD (chronic obstructive pulmonary disease) ([place])   . Dizziness    in am  . Hypertension   . Hyperthyroidism   . Peripheral vascular disease ([place])    Procedure(s): ENDARTERECTOMY CAROTID  Discharged Condition: stable  HPI:  Kristin Rowe is a 82 year old female who presents with right carotid stenosis of 80%. On 10/17/15, the patient underwent a right carotid endarterectomy. She tolerated the procedure well and was transferred to the ICU overnight. Her night of surgery was unremarkable. During her short inpatient stay the patient's diet was advanced and her foley was removed without issue. Her pain was controlled with PO medication and she was ambulating independently.   Hospital Course:  Kristin Rowe is a 82 y.o. female is S/P Right Procedure(s): ENDARTERECTOMY CAROTID  Extubated: POD # 0  Physical exam:  A&Ox3, NAD Face: No facial droop, tongue midline Neck: Some ecchymosis noted. Mild swelling. Trachea midline. CV: RRR Pulm: CTA bilaterally Abd: Soft, nontender, minimal edema Extremity: Warm, Non-tender, minimal edema  Post-op wounds clean, dry, intact or healing well  Pt. Ambulating, voiding and taking PO diet without difficulty.  Pt pain controlled with PO pain meds.  Labs as below  Complications:none  Consults:  None  Significant Diagnostic Studies: CBC Lab Results  Component Value Date   WBC 8.6 10/18/2015   HGB 12.0  10/18/2015   HCT 35.2 10/18/2015   MCV 88.3 10/18/2015   PLT 140 (L) 10/18/2015   BMET    Component Value Date/Time   NA 136 10/18/2015 0530   K 4.4 10/18/2015 0530   CL 105 10/18/2015 0530   CO2 26 10/18/2015 0530   GLUCOSE 144 (H) 10/18/2015 0530   BUN 14 10/18/2015 0530   CREATININE 0.61 10/18/2015 0530   CALCIUM 8.3 (L) 10/18/2015 0530   GFRNONAA >60 10/18/2015 0530   GFRAA >60 10/18/2015 0530   COAG Lab Results  Component Value Date   INR 0.96 10/10/2015   Disposition:  Discharge to :Home    Medication List    TAKE these medications   ALPRAZolam 0.25 MG tablet Commonly known as:  XANAX Take 0.25 mg by mouth 3 (three) times daily as needed for anxiety.   amLODipine 2.5 MG tablet Commonly known as:  NORVASC Take 2.5 mg by mouth daily. Takes 3 to = 7.5 mg   aspirin EC 81 MG tablet Take 81 mg by mouth daily.   clopidogrel 75 MG tablet Commonly known as:  PLAVIX Take 1 tablet (75 mg total) by mouth daily with breakfast. Start taking on:  10/19/2015   COD LIVER OIL PO Take by mouth. Every other day   lisinopril 20 MG tablet Commonly known as:  PRINIVIL,ZESTRIL Take 20 mg by mouth daily.   methimazole 5 MG tablet Commonly known as:  TAPAZOLE Take 5 mg by mouth daily.   metoprolol 100 MG tablet Commonly known as:  LOPRESSOR Take 100 mg by mouth.   traMADol 50 MG tablet Commonly known  as:  ULTRAM Take 1 tablet (50 mg total) by mouth every 6 (six) hours as needed for moderate pain or severe pain.      Verbal and written Discharge instructions given to the patient. Wound care per Discharge AVS Follow-up Information    DEW,JASON, MD Follow up in 2 week(s).   Specialties:  Vascular Surgery, Radiology, Interventional Cardiology Why:  Post-Op Check. Incision Check.  Contact information: [place] [address] [phone]          Signed: Sela Hua, PA-C  10/18/2015, 12:56 PM

## 2015-10-21 LAB — SURGICAL PATHOLOGY

## 2015-10-28 ENCOUNTER — Encounter: Payer: Self-pay | Admitting: Vascular Surgery

## 2015-11-01 ENCOUNTER — Ambulatory Visit (INDEPENDENT_AMBULATORY_CARE_PROVIDER_SITE_OTHER): Payer: Medicare Other | Admitting: Vascular Surgery

## 2015-11-01 ENCOUNTER — Encounter (INDEPENDENT_AMBULATORY_CARE_PROVIDER_SITE_OTHER): Payer: Self-pay | Admitting: Vascular Surgery

## 2015-11-01 VITALS — BP 130/75 | HR 63 | Resp 17 | Ht 65.0 in | Wt 145.0 lb

## 2015-11-01 DIAGNOSIS — I10 Essential (primary) hypertension: Secondary | ICD-10-CM

## 2015-11-01 DIAGNOSIS — I6523 Occlusion and stenosis of bilateral carotid arteries: Secondary | ICD-10-CM

## 2015-11-01 NOTE — Progress Notes (Signed)
Patient ID: Kristin Rowe, female   DOB: [birth year], 82 y.o.   MRN: [id number]  Chief Complaint  Patient presents with  . Follow-up    HPI Kristin Rowe is a 82 y.o. female.  Patient returns 2 weeks after right carotid endarterectomy for high-grade stenosis. She is doing well. She says that she can notice a difference day by day in terms of improvement. She is not back to 100% yet, and still has some sore throat and ear pain. She had no major complications around the time of the procedure.   Past Medical History:  Diagnosis Date  . Arthritis   . Back pain    chronic  . Cancer [facility])    Vaginal - radiation treatments/mohs for melanoma left eyelid  . COPD (chronic obstructive pulmonary disease) ([place])   . Dizziness    in am  . Hypertension   . Hyperthyroidism   . Peripheral vascular disease [facility])     Past Surgical History:  Procedure Laterality Date  . ABDOMINAL HYSTERECTOMY    . ENDARTERECTOMY Right 10/17/2015   Procedure: ENDARTERECTOMY CAROTID;  Surgeon: Algernon Huxley, MD;  Location: [facility] ORS;  Service: Vascular;  Laterality: Right;  . MOHS SURGERY    . vaginectomy     partial      Allergies  Allergen Reactions  . Daypro [Oxaprozin] Other (See Comments)    gi  . Morphine And Related Nausea And Vomiting  . Plaquenil [Hydroxychloroquine Sulfate]     Pt denies  . Ridaura [Auranofin] Other (See Comments)    denies    Current Outpatient Prescriptions  Medication Sig Dispense Refill  . ALPRAZolam (XANAX) 0.25 MG tablet Take 0.25 mg by mouth 3 (three) times daily as needed for anxiety.    Marland Kitchen amLODipine (NORVASC) 2.5 MG tablet Take 2.5 mg by mouth daily. Takes 3 to = 7.5 mg    . aspirin EC 81 MG tablet Take 81 mg by mouth daily.    . clopidogrel (PLAVIX) 75 MG tablet Take 1 tablet (75 mg total) by mouth daily with breakfast. 30 tablet 5  . COD LIVER OIL PO Take by mouth. Every other day    . lisinopril (PRINIVIL,ZESTRIL) 20 MG tablet Take 20 mg by mouth daily.    .  methimazole (TAPAZOLE) 5 MG tablet Take 5 mg by mouth daily.    . metoprolol (LOPRESSOR) 100 MG tablet Take 100 mg by mouth.    . traMADol (ULTRAM) 50 MG tablet Take 1 tablet (50 mg total) by mouth every 6 (six) hours as needed for moderate pain or severe pain. 20 tablet 0   No current facility-administered medications for this visit.         Physical Exam BP 130/75   Pulse 63   Resp 17   Ht 5\' 5"  (1.651 m)   Wt 65.8 kg (145 lb)   BMI 24.13 kg/m  Gen:  WD/WN, NAD Skin: incision C/D/I     Assessment/Plan:  Essential hypertension blood pressure control important in reducing the progression of atherosclerotic disease. On appropriate oral medications.   Carotid stenosis, asymptomatic The patient is 2 weeks status post right carotid endarterectomy. She is doing quite well. She can resume all normal activities. We will plan to see her back in 3 months with carotid duplex for follow-up.      Leotis Pain 11/01/2015, 11:36 AM   This note was created with Dragon medical transcription system.  Any errors from dictation are unintentional.

## 2015-11-01 NOTE — Assessment & Plan Note (Signed)
The patient is 2 weeks status post right carotid endarterectomy. She is doing quite well. She can resume all normal activities. We will plan to see her back in 3 months with carotid duplex for follow-up.

## 2015-11-01 NOTE — Assessment & Plan Note (Signed)
blood pressure control important in reducing the progression of atherosclerotic disease. On appropriate oral medications.  

## 2016-01-31 ENCOUNTER — Encounter (INDEPENDENT_AMBULATORY_CARE_PROVIDER_SITE_OTHER): Payer: Medicare Other

## 2016-01-31 ENCOUNTER — Encounter (INDEPENDENT_AMBULATORY_CARE_PROVIDER_SITE_OTHER): Payer: Self-pay

## 2016-01-31 ENCOUNTER — Ambulatory Visit (INDEPENDENT_AMBULATORY_CARE_PROVIDER_SITE_OTHER): Payer: Medicare Other | Admitting: Vascular Surgery

## 2016-02-03 ENCOUNTER — Other Ambulatory Visit (INDEPENDENT_AMBULATORY_CARE_PROVIDER_SITE_OTHER): Payer: Self-pay | Admitting: Vascular Surgery

## 2016-02-03 ENCOUNTER — Encounter (INDEPENDENT_AMBULATORY_CARE_PROVIDER_SITE_OTHER): Payer: Self-pay | Admitting: Vascular Surgery

## 2016-02-03 ENCOUNTER — Ambulatory Visit (INDEPENDENT_AMBULATORY_CARE_PROVIDER_SITE_OTHER): Payer: Medicare Other

## 2016-02-03 ENCOUNTER — Ambulatory Visit (INDEPENDENT_AMBULATORY_CARE_PROVIDER_SITE_OTHER): Payer: Medicare Other | Admitting: Vascular Surgery

## 2016-02-03 VITALS — BP 153/79 | HR 66 | Resp 16 | Wt 144.0 lb

## 2016-02-03 DIAGNOSIS — I6523 Occlusion and stenosis of bilateral carotid arteries: Secondary | ICD-10-CM

## 2016-02-03 DIAGNOSIS — I10 Essential (primary) hypertension: Secondary | ICD-10-CM

## 2016-02-03 NOTE — Progress Notes (Signed)
Subjective:    Patient ID: Kristin Rowe, female    DOB: Aug 31, 1926, 83 y.o.   MRN: [id number] Chief Complaint  Patient presents with  . Follow-up   Patient presents for a non-invasive three month follow up for carotid stenosis. She is s/p a right CEA on 10/17/15. She presents today without complaint. Asking if she can stop Plavix. The patient underwent a bilateral carotid duplex scan which was notable for a patent right CEA and Left ICA stenosis (1-39%). The patient denies experiencing Amaurosis Fugax, TIA like symptoms or focal motor deficits. Incision has healed well.    Review of Systems  Constitutional: Negative.   HENT: Negative.   Eyes: Negative.   Respiratory: Negative.   Cardiovascular: Negative.   Gastrointestinal: Negative.   Endocrine: Negative.   Genitourinary: Negative.   Musculoskeletal: Negative.   Skin: Negative.   Allergic/Immunologic: Negative.   Neurological: Negative.   Hematological: Negative.   Psychiatric/Behavioral: Negative.       Objective:   Physical Exam  Constitutional: She is oriented to person, place, and time. She appears well-developed and well-nourished.  HENT:  Head: Normocephalic and atraumatic.  Right Ear: External ear normal.  Left Ear: External ear normal.  Eyes: Conjunctivae and EOM are normal. Pupils are equal, round, and reactive to light.  Neck: Normal range of motion.  Cardiovascular: Normal rate, regular rhythm, normal heart sounds and intact distal pulses.   Pulses:      Radial pulses are 2+ on the right side, and 2+ on the left side.       Dorsalis pedis pulses are 2+ on the right side, and 2+ on the left side.       Posterior tibial pulses are 2+ on the right side, and 2+ on the left side.  No carotid bruits noted.  Pulmonary/Chest: Effort normal and breath sounds normal.  Abdominal: Soft. Bowel sounds are normal.  Musculoskeletal: Normal range of motion. She exhibits no edema.  Neurological: She is alert and oriented to  person, place, and time.  Skin: Skin is warm and dry.  Psychiatric: She has a normal mood and affect. Her behavior is normal. Judgment and thought content normal.   BP (!) 153/79   Pulse 66   Resp 16   Wt 144 lb (65.3 kg)   BMI 23.96 kg/m   Past Medical History:  Diagnosis Date  . Arthritis   . Back pain    chronic  . Cancer [facility])    Vaginal - radiation treatments/mohs for melanoma left eyelid  . COPD (chronic obstructive pulmonary disease) ([place])   . Dizziness    in am  . Hypertension   . Hyperthyroidism   . Peripheral vascular disease [facility])    Social History   Social History  . Marital status: Widowed    Spouse name: N/A  . Number of children: N/A  . Years of education: N/A   Occupational History  . Not on file.   Social History Main Topics  . Smoking status: Former Smoker    Quit date: 10/09/1985  . Smokeless tobacco: Never Used  . Alcohol use No  . Drug use: No  . Sexual activity: No   Other Topics Concern  . Not on file   Social History Narrative  . No narrative on file   Past Surgical History:  Procedure Laterality Date  . ABDOMINAL HYSTERECTOMY    . ENDARTERECTOMY Right 10/17/2015   Procedure: ENDARTERECTOMY CAROTID;  Surgeon: Algernon Huxley, MD;  [facility]  ORS;  Service: Vascular;  Laterality: Right;  . MOHS SURGERY    . vaginectomy     partial   No family history on file.  Allergies  Allergen Reactions  . Daypro [Oxaprozin] Other (See Comments)    gi  . Morphine And Related Nausea And Vomiting  . Plaquenil [Hydroxychloroquine Sulfate]     Pt denies  . Ridaura [Auranofin] Other (See Comments)    denies      Assessment & Plan:  Patient presents for a non-invasive three month follow up for carotid stenosis. She is s/p a right CEA on 10/17/15. She presents today without complaint. Asking if she can stop Plavix. The patient underwent a bilateral carotid duplex scan which was notable for a patent right CEA and Left ICA stenosis (1-39%). The  patient denies experiencing Amaurosis Fugax, TIA like symptoms or focal motor deficits. Incision has healed well.  1. Asymptomatic bilateral carotid artery stenosis s/p Right CEA on 10/17/15 - Improved OK to stop Plavix as she is s/p surgery x three months. Studies reviewed with patient. Patient asymptomatic with stable duplex.  No intervention at this time.  Patient to return in six months for surveillance carotid duplex. Patient to continue medical optimization with ASA and dyslipidemia medication. Patient to remain abstinent of tobacco use. I have discussed with the patient at length the risk factors for and pathogenesis of atherosclerotic disease and encouraged a healthy diet, regular exercise regimen and blood pressure / glucose control.  Patient was instructed to contact our office in the interim with problems such as arm / leg weakness or numbness, speech / swallowing difficulty or temporary monocular blindness. The patient expresses their understanding.   - VAS US CAROTID; Future  2. Essential hypertension - Stable Encouraged good control as its slows the progression of atherosclerotic disease.   Current Outpatient Prescriptions on File Prior to Visit  Medication Sig Dispense Refill  . ALPRAZolam (XANAX) 0.25 MG tablet Take 0.25 mg by mouth 3 (three) times daily as needed for anxiety.    Marland Kitchen amLODipine (NORVASC) 2.5 MG tablet Take 2.5 mg by mouth daily. Takes 3 to = 7.5 mg    . aspirin EC 81 MG tablet Take 81 mg by mouth daily.    . clopidogrel (PLAVIX) 75 MG tablet Take 1 tablet (75 mg total) by mouth daily with breakfast. 30 tablet 5  . COD LIVER OIL PO Take by mouth. Every other day    . lisinopril (PRINIVIL,ZESTRIL) 20 MG tablet Take 20 mg by mouth daily.    . methimazole (TAPAZOLE) 5 MG tablet Take 5 mg by mouth daily.    . metoprolol (LOPRESSOR) 100 MG tablet Take 100 mg by mouth.    . traMADol (ULTRAM) 50 MG tablet Take 1 tablet (50 mg total) by mouth every 6 (six) hours as  needed for moderate pain or severe pain. 20 tablet 0   No current facility-administered medications on file prior to visit.     There are no Patient Instructions on file for this visit. No Follow-up on file.   Allycia Pitz A Krissa Utke, PA-C

## 2016-08-10 ENCOUNTER — Encounter (INDEPENDENT_AMBULATORY_CARE_PROVIDER_SITE_OTHER): Payer: Self-pay | Admitting: Vascular Surgery

## 2016-08-10 ENCOUNTER — Encounter (INDEPENDENT_AMBULATORY_CARE_PROVIDER_SITE_OTHER): Payer: Self-pay

## 2016-08-10 ENCOUNTER — Ambulatory Visit (INDEPENDENT_AMBULATORY_CARE_PROVIDER_SITE_OTHER): Payer: Medicare Other

## 2016-08-10 ENCOUNTER — Ambulatory Visit (INDEPENDENT_AMBULATORY_CARE_PROVIDER_SITE_OTHER): Payer: Medicare Other | Admitting: Vascular Surgery

## 2016-08-10 DIAGNOSIS — I6523 Occlusion and stenosis of bilateral carotid arteries: Secondary | ICD-10-CM | POA: Diagnosis not present

## 2016-08-14 ENCOUNTER — Ambulatory Visit (INDEPENDENT_AMBULATORY_CARE_PROVIDER_SITE_OTHER): Payer: Medicare Other | Admitting: Vascular Surgery

## 2016-08-14 ENCOUNTER — Encounter (INDEPENDENT_AMBULATORY_CARE_PROVIDER_SITE_OTHER): Payer: Medicare Other

## 2016-09-09 ENCOUNTER — Other Ambulatory Visit: Payer: Self-pay | Admitting: Otolaryngology

## 2016-09-09 DIAGNOSIS — R9389 Abnormal findings on diagnostic imaging of other specified body structures: Secondary | ICD-10-CM

## 2016-09-22 ENCOUNTER — Ambulatory Visit
Admission: RE | Admit: 2016-09-22 | Discharge: 2016-09-22 | Disposition: A | Discharge status: Home / Self Care | Payer: Self-pay | Source: Ambulatory Visit | Attending: Otolaryngology | Admitting: Otolaryngology

## 2016-09-22 ENCOUNTER — Other Ambulatory Visit: Payer: Self-pay | Admitting: Otolaryngology

## 2016-09-22 ENCOUNTER — Ambulatory Visit
Admission: RE | Admit: 2016-09-22 | Discharge: 2016-09-22 | Disposition: A | Discharge status: Home / Self Care | Payer: Medicare Other | Source: Ambulatory Visit | Attending: Otolaryngology | Admitting: Otolaryngology

## 2016-09-22 DIAGNOSIS — R938 Abnormal findings on diagnostic imaging of other specified body structures: Secondary | ICD-10-CM | POA: Insufficient documentation

## 2016-09-22 DIAGNOSIS — H9192 Unspecified hearing loss, left ear: Secondary | ICD-10-CM

## 2016-09-22 DIAGNOSIS — R600 Localized edema: Secondary | ICD-10-CM | POA: Diagnosis not present

## 2016-09-22 DIAGNOSIS — R9389 Abnormal findings on diagnostic imaging of other specified body structures: Secondary | ICD-10-CM

## 2016-11-16 NOTE — Progress Notes (Signed)
11/17/2016 3:44 PM   Kristin Rowe [birth year] [id number]  Referring provider: [name], MD [address] [facility] Kristin Rowe [id number]  Chief Complaint  Patient presents with  . New Patient (Initial Visit)  . Hematuria    HPI: Patient is a 83 -year-old Caucasian female who presents today as a referral from their PCP, Dr. Kary Rowe, for microscopic hematuria with her daughter, Kristin Rowe.     Patient was found to have microscopic hematuria on 10/15/2016 with 4-10 RBC's/hpf.      She does have a remote history of nephrolithiasis.  She does not a history of genitourinary tract or malignancies of the genitourinary tract.  She does have a family medical history of nephrolithiasis, but she does not have a family history of malignancies of the genitourinary tract or hematuria.  Today, she is having symptoms of frequent urination, urgency, dysuria, nocturia and incontinence.  Her UA today is negative.  She is not experiencing any suprapubic pain, abdominal pain or flank pain.   She denies any recent fevers, chills, nausea or vomiting.   She is complaining of vaginal discomfort at this time.  She states she has to put a cold washcloth to the vaginal area at times to relieve the burning and stinging.  This is been occurring for the last several weeks.  She has not seen blood when she wipes on the tissue.  She is not having any vaginal discharge and she is not sexually active.  She has not had any recent imaging studies.   She is a former smoker, with a 1 ppd history.  Quit 31 years ago.  She is not exposed to secondhand smoke.  She worked with fabrics in the past.  She has HTN.    PMH: Past Medical History:  Diagnosis Date  . Anxiety   . Arthritis   . Back pain    chronic  . Cancer [facility])    Vaginal - radiation treatments/mohs for melanoma left eyelid  . COPD (chronic obstructive pulmonary disease) ([place])   . Dizziness    in am  . Heart murmur   . Hypertension     . Hyperthyroidism   . Peripheral vascular disease [facility])     Surgical History: Past Surgical History:  Procedure Laterality Date  . ABDOMINAL HYSTERECTOMY    . ENDARTERECTOMY Right 10/17/2015   Procedure: ENDARTERECTOMY CAROTID;  Surgeon: Kristin Huxley, MD;  Location: [facility] ORS;  Service: Vascular;  Laterality: Right;  . MOHS SURGERY    . vaginectomy     partial    Home Medications:  Allergies as of 11/17/2016      Reactions   Daypro [oxaprozin] Other (See Comments)   gi   Morphine And Related Nausea And Vomiting   Plaquenil [hydroxychloroquine Sulfate]    Pt denies   Ridaura [auranofin] Other (See Comments)   denies      Medication List       Accurate as of 11/17/16  3:44 PM. Always use your most recent med list.          ALPRAZolam 0.25 MG tablet Commonly known as:  XANAX Take 0.25 mg by mouth 3 (three) times daily as needed for anxiety.   amLODipine 2.5 MG tablet Commonly known as:  NORVASC Take 2.5 mg by mouth daily. Takes 3 to = 7.5 mg   aspirin EC 81 MG tablet Take 81 mg by mouth daily.   clopidogrel 75 MG tablet Commonly known as:  PLAVIX Take  1 tablet (75 mg total) by mouth daily with breakfast.   COD LIVER OIL PO Take by mouth. Every other day   conjugated estrogens vaginal cream Commonly known as:  PREMARIN Place 1 Applicatorful vaginally daily. Apply 0.5mg  (pea-sized amount)  just inside the vaginal introitus with a finger-tip every night for two weeks and then Monday, Wednesday and Friday nights.   estradiol 0.1 MG/GM vaginal cream Commonly known as:  ESTRACE VAGINAL Apply 0.5mg  (pea-sized amount)  just inside the vaginal introitus with a finger-tip every night for two weeks and then Monday, Wednesday and Friday nights.   lisinopril 20 MG tablet Commonly known as:  PRINIVIL,ZESTRIL Take 20 mg by mouth daily.   methimazole 5 MG tablet Commonly known as:  TAPAZOLE Take 5 mg by mouth daily.   metoprolol tartrate 100 MG tablet Commonly known  as:  LOPRESSOR Take 100 mg by mouth.   traMADol 50 MG tablet Commonly known as:  ULTRAM Take 1 tablet (50 mg total) by mouth every 6 (six) hours as needed for moderate pain or severe pain.       Allergies:  Allergies  Allergen Reactions  . Daypro [Oxaprozin] Other (See Comments)    gi  . Morphine And Related Nausea And Vomiting  . Plaquenil [Hydroxychloroquine Sulfate]     Pt denies  . Ridaura [Auranofin] Other (See Comments)    denies    Family History: Family History  Problem Relation Age of Onset  . Kidney disease Sister   . Kidney Stones Sister   . Kidney cancer Neg Hx   . Prostate cancer Neg Hx     Social History:  reports that she quit smoking about 31 years ago. She has never used smokeless tobacco. She reports that she does not drink alcohol or use drugs.  ROS: UROLOGY Frequent Urination?: Yes Hard to postpone urination?: Yes Burning/pain with urination?: Yes Get up at night to urinate?: Yes Leakage of urine?: Yes Urine stream starts and stops?: No Trouble starting stream?: No Do you have to strain to urinate?: No Blood in urine?: Yes Urinary tract infection?: No Sexually transmitted disease?: No Injury to kidneys or bladder?: No Painful intercourse?: No Weak stream?: No Currently pregnant?: No Vaginal bleeding?: No Last menstrual period?: n  Gastrointestinal Nausea?: No Vomiting?: No Indigestion/heartburn?: No Diarrhea?: Yes Constipation?: No  Constitutional Fever: No Night sweats?: No Weight loss?: No Fatigue?: Yes  Skin Skin rash/lesions?: No Itching?: No  Eyes Blurred vision?: Yes Double vision?: Yes  Ears/Nose/Throat Sore throat?: No Sinus problems?: No  Hematologic/Lymphatic Swollen glands?: No Easy bruising?: Yes  Cardiovascular Leg swelling?: Yes Chest pain?: No  Respiratory Cough?: No Shortness of breath?: Yes  Endocrine Excessive thirst?: No  Musculoskeletal Back pain?: Yes Joint pain?:  Yes  Neurological Headaches?: No Dizziness?: Yes  Psychologic Depression?: No Anxiety?: Yes  Physical Exam: BP (!) 159/82   Pulse (!) 58   Ht 5\' 5"  (1.651 m)   Wt 144 lb (65.3 kg)   BMI 23.96 kg/m   Constitutional: Well nourished. Alert and oriented, No acute distress. HEENT: Violet AT, moist mucus membranes. Trachea midline, no masses. Cardiovascular: No clubbing, cyanosis, or edema. Respiratory: Normal respiratory effort, no increased work of breathing. GI: Abdomen is soft, non tender, non distended, no abdominal masses. Liver and spleen not palpable.  No hernias appreciated.  Stool sample for occult testing is not indicated.   GU: No CVA tenderness.  No bladder fullness or masses.  Atrophic external genitalia, normal pubic hair distribution, no lesions.  Urethral caruncle is noted.  No bladder fullness, tenderness or masses. Normal vagina mucosa, poor estrogen effect, no discharge, no lesions, good pelvic support, no cystocele or rectocele noted.  Cervix and uterus are surgically absent.  No adnexal/parametria masses or tenderness noted.  Anus and perineum are without rashes or lesions.    Skin: No rashes, bruises or suspicious lesions. Lymph: No cervical or inguinal adenopathy. Neurologic: Grossly intact, no focal deficits, moving all 4 extremities. Psychiatric: Normal mood and affect.  Laboratory Data: Urinalysis Negative.  See EPIC.   I have reviewed the labs.  Assessment & Plan:    1. Microscopic hematuria  - I explained to the patient that there are a number of causes that can be associated with blood in the urine, such as stones, UTI's, damage to the urinary tract and/or cancer.  - At this time, I felt that the patient warranted further urologic evaluation.   The AUA guidelines state that a CT urogram is the preferred imaging study to evaluate hematuria - do to her age the patient does not want to pursue CTU and cystoscopy at this time - she would like to have her urine  rechecked in 2 weeks when she returns for her visit - she understands the risk of a missed diagnosis of a GU cancer - she will report any gross hematuria  - UA  - Urine culture  - BUN + creatinine    2. Vaginal atrophy  - Patient was given a sample of vaginal estrogen cream (Premarin) and instructed to apply 0.5mg  (pea-sized amount)  just inside the vaginal introitus with a finger-tip every night for two weeks and then Monday, Wednesday and Friday nights.  I explained to the patient that vaginally administered estrogen, which causes only a slight increase in the blood estrogen levels, have fewer contraindications and adverse systemic effects that oral HT.  - I have also given prescriptions for the Estrace cream and Premarin cream, so that the patient may carry them to the pharmacy to see which one of the branded creams would be most economical for her.  If she finds both medications cost prohibitive, she is instructed to call the office.  We can then call in a compounded vaginal estrogen cream for the patient that may be more affordable.    - She will follow up in three months for an exam.    Return in about 2 weeks (around 12/01/2016) for exam and UA.  These notes generated with voice recognition software. I apologize for typographical errors.  Zara Council, [facility] [address] [place], [place] [address] [phone]

## 2016-11-17 ENCOUNTER — Ambulatory Visit: Payer: Self-pay | Admitting: Urology

## 2016-11-17 ENCOUNTER — Encounter: Payer: Self-pay | Admitting: Urology

## 2016-11-17 ENCOUNTER — Telehealth: Payer: Self-pay | Admitting: Urology

## 2016-11-17 ENCOUNTER — Ambulatory Visit (INDEPENDENT_AMBULATORY_CARE_PROVIDER_SITE_OTHER): Payer: Medicare Other | Admitting: Urology

## 2016-11-17 VITALS — BP 159/82 | HR 58 | Ht 65.0 in | Wt 144.0 lb

## 2016-11-17 DIAGNOSIS — N952 Postmenopausal atrophic vaginitis: Secondary | ICD-10-CM | POA: Diagnosis not present

## 2016-11-17 DIAGNOSIS — R3129 Other microscopic hematuria: Secondary | ICD-10-CM | POA: Diagnosis not present

## 2016-11-17 LAB — URINALYSIS, COMPLETE
BILIRUBIN UA: NEGATIVE
GLUCOSE, UA: NEGATIVE
Ketones, UA: NEGATIVE
LEUKOCYTES UA: NEGATIVE
Nitrite, UA: NEGATIVE
PROTEIN UA: NEGATIVE
SPEC GRAV UA: 1.025 (ref 1.005–1.030)
Urobilinogen, Ur: 0.2 mg/dL (ref 0.2–1.0)
pH, UA: 5.5 (ref 5.0–7.5)

## 2016-11-17 MED ORDER — ESTROGENS, CONJUGATED 0.625 MG/GM VA CREA: 1.0000 | TOPICAL_CREAM | Freq: Every day | VAGINAL | 12 refills | Status: DC

## 2016-11-17 MED ORDER — ESTRADIOL 0.1 MG/GM VA CREA: TOPICAL_CREAM | VAGINAL | 12 refills | Status: DC

## 2016-11-17 NOTE — Telephone Encounter (Signed)
Done

## 2016-11-17 NOTE — Telephone Encounter (Signed)
Would you send my note to Dr. Kary Kos?

## 2016-11-18 LAB — BUN+CREAT
BUN / CREAT RATIO: 14 (ref 12–28)
BUN: 13 mg/dL (ref 8–27)
CREATININE: 0.94 mg/dL (ref 0.57–1.00)
GFR calc Af Amer: 63 mL/min/{1.73_m2} (ref >59)
GFR calc non Af Amer: 54 mL/min/{1.73_m2} — ABNORMAL LOW (ref >59)

## 2016-11-20 LAB — CULTURE, URINE COMPREHENSIVE

## 2016-12-01 NOTE — Progress Notes (Signed)
12/02/2016 2:50 PM   Kristin Rowe 1926-12-08 [id number]  Referring provider: [name], MD [address] [facility] [place], [place] [address]  Chief Complaint  Patient presents with  . Hematuria    HPI: [name] with vaginal atrophy and a history of microscopic hematuria who presents today for a 2 week follow up after starting vaginal estrogen cream.  Background history Patient is a 72 -year-old Caucasian female who presents today as a referral from their PCP, Dr. Kary Kos, for microscopic hematuria with her daughter, [name].   Patient was found to have microscopic hematuria on 10/15/2016 with 4-10 RBC's/hpf.   She does have a remote history of nephrolithiasis.  She does not a history of genitourinary tract or malignancies of the genitourinary tract.  She does have a family medical history of nephrolithiasis, but she does not have a family history of malignancies of the genitourinary tract or hematuria.  Today, she is having symptoms of frequent urination, urgency, dysuria, nocturia and incontinence.  Her UA today is negative.  She is not experiencing any suprapubic pain, abdominal pain or flank pain.   She denies any recent fevers, chills, nausea or vomiting.   She is complaining of vaginal discomfort at this time.  She states she has to put a cold washcloth to the vaginal area at times to relieve the burning and stinging.  This is been occurring for the last several weeks.  She has not seen blood when she wipes on the tissue.  She is not having any vaginal discharge and she is not sexually active.  She has not had any recent imaging studies.   She is a former smoker, with a 1 ppd history.  Quit 31 years ago.  She is not exposed to secondhand smoke.  She worked with fabrics in the past.  She has HTN.   Today, she is still having dysuria, nocturia and leakage.   She is not having suprapubic pain or gross hematuria.  She has not had fevers, chills, nausea or vomiting.  Her UA  today is positive for 3-10 RBC's.    She does not want to pursue any treatments for her incontinence.    She is using the vaginal estrogen cream at this time.      PMH: Past Medical History:  Diagnosis Date  . Anxiety   . Arthritis   . Back pain    chronic  . Cancer [facility])    Vaginal - radiation treatments/mohs for melanoma left eyelid  . COPD (chronic obstructive pulmonary disease) ([place])   . Dizziness    in am  . Heart murmur   . Hypertension   . Hyperthyroidism   . Peripheral vascular disease [facility])     Surgical History: Past Surgical History:  Procedure Laterality Date  . ABDOMINAL HYSTERECTOMY    . MOHS SURGERY    . vaginectomy     partial    Home Medications:  Allergies as of 12/02/2016      Reactions   Daypro [oxaprozin] Other (See Comments)   gi   Morphine And Related Nausea And Vomiting   Plaquenil [hydroxychloroquine Sulfate]    Pt denies   Ridaura [auranofin] Other (See Comments)   denies      Medication List        Accurate as of 12/02/16  2:50 PM. Always use your most recent med list.          ALPRAZolam 0.25 MG tablet Commonly known as:  XANAX Take 0.25 mg by mouth 3 (three) times daily as needed for anxiety.   amLODipine 2.5 MG tablet Commonly known as:  NORVASC Take 2.5 mg by mouth daily. Takes 3 to = 7.5 mg   aspirin EC 81 MG tablet Take 81 mg every other day by mouth.   COD LIVER OIL PO Take by mouth. Every other day   conjugated estrogens vaginal cream Commonly known as:  PREMARIN Place 1 Applicatorful vaginally daily. Apply 0.5mg  (pea-sized amount)  just inside the vaginal introitus with a finger-tip every night for two weeks and then Monday, Wednesday and Friday nights.   estradiol 0.1 MG/GM vaginal cream Commonly known as:  ESTRACE VAGINAL Apply 0.5mg  (pea-sized amount)  just inside the vaginal introitus with a finger-tip every night for two weeks and then Monday, Wednesday and Friday nights.   lisinopril 20 MG  tablet Commonly known as:  PRINIVIL,ZESTRIL Take 20 mg by mouth daily.   methimazole 5 MG tablet Commonly known as:  TAPAZOLE Take 5 mg by mouth daily.   metoprolol tartrate 100 MG tablet Commonly known as:  LOPRESSOR Take 100 mg by mouth.   traMADol 50 MG tablet Commonly known as:  ULTRAM Take 1 tablet (50 mg total) by mouth every 6 (six) hours as needed for moderate pain or severe pain.       Allergies:  Allergies  Allergen Reactions  . Daypro [Oxaprozin] Other (See Comments)    gi  . Morphine And Related Nausea And Vomiting  . Plaquenil [Hydroxychloroquine Sulfate]     Pt denies  . Ridaura [Auranofin] Other (See Comments)    denies    Family History: Family History  Problem Relation Age of Onset  . Kidney disease Sister   . Kidney Stones Sister   . Kidney cancer Neg Hx   . Prostate cancer Neg Hx     Social History:  reports that she quit smoking about 31 years ago. she has never used smokeless tobacco. She reports that she does not drink alcohol or use drugs.  ROS: UROLOGY Frequent Urination?: No Hard to postpone urination?: No Burning/pain with urination?: Yes Get up at night to urinate?: Yes Leakage of urine?: Yes Urine stream starts and stops?: No Trouble starting stream?: No Do you have to strain to urinate?: No Blood in urine?: No Urinary tract infection?: No Sexually transmitted disease?: No Injury to kidneys or bladder?: No Painful intercourse?: No Weak stream?: No Currently pregnant?: No Vaginal bleeding?: No Last menstrual period?: n  Gastrointestinal Nausea?: No Vomiting?: No Indigestion/heartburn?: No Diarrhea?: No Constipation?: No  Constitutional Fever: No Night sweats?: No Weight loss?: No Fatigue?: No  Skin Skin rash/lesions?: No Itching?: No  Eyes Blurred vision?: No Double vision?: No  Ears/Nose/Throat Sore throat?: No Sinus problems?: No  Hematologic/Lymphatic Swollen glands?: No Easy bruising?:  No  Cardiovascular Leg swelling?: No Chest pain?: No  Respiratory Cough?: No Shortness of breath?: No  Endocrine Excessive thirst?: No  Musculoskeletal Back pain?: No Joint pain?: No  Neurological Headaches?: No  Psychologic Depression?: No Anxiety?: Yes  Physical Exam: BP (!) 189/78   Pulse 65   Ht 5\' 5"  (1.651 m)   Wt 144 lb 12.8 oz (65.7 kg)   BMI 24.10 kg/m   Constitutional: Well nourished. Alert and oriented, No acute distress. HEENT: [place] AT, moist mucus membranes. Trachea midline, no masses. Cardiovascular: No clubbing, cyanosis, or edema. Respiratory: Normal respiratory effort, no increased work of breathing. GI: Abdomen is soft, non tender, non distended, no abdominal  masses. Liver and spleen not palpable.  No hernias appreciated.  Stool sample for occult testing is not indicated.   GU: No CVA tenderness.  No bladder fullness or masses.  Atrophic external genitalia, normal pubic hair distribution, no lesions.  Urethral caruncle is noted.  No bladder fullness, tenderness or masses. Normal vagina mucosa, poor estrogen effect, no discharge, no lesions, good pelvic support, no cystocele or rectocele noted.  Cervix and uterus are surgically absent.  No adnexal/parametria masses or tenderness noted.  Anus and perineum are without rashes or lesions.    Skin: No rashes, bruises or suspicious lesions. Lymph: No cervical or inguinal adenopathy. Neurologic: Grossly intact, no focal deficits, moving all 4 extremities. Psychiatric: Normal mood and affect.  Laboratory Data: Urinalysis 3-10 RBC's.  See EPIC.   I have reviewed the labs.  Assessment & Plan:    1. Microscopic hematuria  - I explained to the patient that there are a number of causes that can be associated with blood in the urine, such as stones, UTI's, damage to the urinary tract and/or cancer.  - At this time, I felt that the patient warranted further urologic evaluation.   The AUA guidelines state that a CT  urogram is the preferred imaging study to evaluate hematuria.  - I explained to the patient that a contrast material will be injected into a vein and that in rare instances, an allergic reaction can result and may even life threatening   The patient denies any allergies to contrast, iodine and/or seafood and is not taking metformin.  - Following the imaging study,  I've recommended a cystoscopy. I described how this is performed, typically in an office setting with a flexible cystoscope. We described the risks, benefits, and possible side effects, the most common of which is a minor amount of blood in the urine and/or burning which usually resolves in 24 to 48 hours.    - The patient had the opportunity to ask questions which were answered. Based upon this discussion, the patient is willing to proceed. Therefore, I've ordered: a CT Urogram and cystoscopy.  - The patient will return following all of the above for discussion of the results.   - UA  - Urine culture  - BUN + creatinine    2. Vaginal atrophy  - continue the vaginal cream tid weekly  - She will follow up in three months for an exam.    Return for CT Urogram report and cystoscopy.  These notes generated with voice recognition software. I apologize for typographical errors.  Zara Council, [facility] [address], [place], [place] [address] [phone]

## 2016-12-02 ENCOUNTER — Encounter: Payer: Self-pay | Admitting: Urology

## 2016-12-02 ENCOUNTER — Ambulatory Visit: Payer: Medicare Other | Admitting: Urology

## 2016-12-02 VITALS — BP 189/78 | HR 65 | Ht 65.0 in | Wt 144.8 lb

## 2016-12-02 DIAGNOSIS — R3129 Other microscopic hematuria: Secondary | ICD-10-CM | POA: Diagnosis not present

## 2016-12-02 DIAGNOSIS — N952 Postmenopausal atrophic vaginitis: Secondary | ICD-10-CM

## 2016-12-03 LAB — BUN+CREAT
BUN / CREAT RATIO: 25 (ref 12–28)
BUN: 21 mg/dL (ref 8–27)
CREATININE: 0.85 mg/dL (ref 0.57–1.00)
GFR calc Af Amer: 71 mL/min/{1.73_m2} (ref >59)
GFR calc non Af Amer: 61 mL/min/{1.73_m2} (ref >59)

## 2016-12-03 LAB — URINALYSIS, COMPLETE
BILIRUBIN UA: NEGATIVE
GLUCOSE, UA: NEGATIVE
KETONES UA: NEGATIVE
Leukocytes, UA: NEGATIVE
NITRITE UA: NEGATIVE
Protein, UA: NEGATIVE
SPEC GRAV UA: 1.025 (ref 1.005–1.030)
UUROB: 0.2 mg/dL (ref 0.2–1.0)
pH, UA: 5.5 (ref 5.0–7.5)

## 2016-12-03 LAB — MICROSCOPIC EXAMINATION
Bacteria, UA: NONE SEEN
Epithelial Cells (non renal): NONE SEEN /hpf (ref 0–10)
WBC, UA: NONE SEEN /hpf (ref 0–?)

## 2016-12-04 ENCOUNTER — Telehealth: Payer: Self-pay | Admitting: Urology

## 2016-12-04 NOTE — Telephone Encounter (Signed)
Patient's daughter called and wanted to know if her lab work was back and if she was ok to move forward with her CT scan?  Please advise   Sharyn Lull

## 2016-12-04 NOTE — Telephone Encounter (Signed)
Yes. Lab work is good.  They should be calling her to schedule the CT.

## 2016-12-05 LAB — CULTURE, URINE COMPREHENSIVE

## 2016-12-08 ENCOUNTER — Telehealth: Payer: Self-pay

## 2016-12-08 NOTE — Telephone Encounter (Signed)
-----   Message from Nori Riis, PA-C sent at 12/06/2016  8:40 PM EST ----- Please let Mrs. Kosar know that her urine culture was negative.

## 2016-12-08 NOTE — Telephone Encounter (Signed)
Spoke with pt daughter in reference to ucx results. Daughter voiced understanding.

## 2016-12-16 ENCOUNTER — Ambulatory Visit
Admission: RE | Admit: 2016-12-16 | Discharge: 2016-12-16 | Disposition: A | Discharge status: Home / Self Care | Payer: Medicare Other | Source: Ambulatory Visit | Attending: Urology | Admitting: Urology

## 2016-12-16 DIAGNOSIS — K5730 Diverticulosis of large intestine without perforation or abscess without bleeding: Secondary | ICD-10-CM | POA: Insufficient documentation

## 2016-12-16 DIAGNOSIS — I700 Atherosclerosis of aorta: Secondary | ICD-10-CM | POA: Insufficient documentation

## 2016-12-16 DIAGNOSIS — R3129 Other microscopic hematuria: Secondary | ICD-10-CM | POA: Insufficient documentation

## 2016-12-16 DIAGNOSIS — K8020 Calculus of gallbladder without cholecystitis without obstruction: Secondary | ICD-10-CM | POA: Insufficient documentation

## 2016-12-16 MED ORDER — IOPAMIDOL (ISOVUE-300) INJECTION 61%
125.0000 mL | Freq: Once | INTRAVENOUS | Status: AC | PRN
  Administered 2016-12-16: 125 mL via INTRAVENOUS

## 2016-12-24 ENCOUNTER — Encounter: Payer: Self-pay | Admitting: Urology

## 2016-12-24 ENCOUNTER — Ambulatory Visit: Payer: Medicare Other | Admitting: Urology

## 2016-12-24 VITALS — BP 143/75 | HR 67 | Ht 65.0 in | Wt 144.0 lb

## 2016-12-24 DIAGNOSIS — R3129 Other microscopic hematuria: Secondary | ICD-10-CM | POA: Diagnosis not present

## 2016-12-24 LAB — MICROSCOPIC EXAMINATION: WBC, UA: NONE SEEN /hpf (ref 0–?)

## 2016-12-24 LAB — URINALYSIS, COMPLETE
Bilirubin, UA: NEGATIVE
Glucose, UA: NEGATIVE
Ketones, UA: NEGATIVE
LEUKOCYTES UA: NEGATIVE
NITRITE UA: NEGATIVE
Protein, UA: NEGATIVE
SPEC GRAV UA: 1.02 (ref 1.005–1.030)
Urobilinogen, Ur: 0.2 mg/dL (ref 0.2–1.0)
pH, UA: 5.5 (ref 5.0–7.5)

## 2016-12-24 MED ORDER — CIPROFLOXACIN HCL 500 MG PO TABS
500.0000 mg | ORAL_TABLET | Freq: Once | ORAL | Status: AC
  Administered 2016-12-24: 500 mg via ORAL

## 2016-12-24 MED ORDER — LIDOCAINE HCL 2 % EX GEL
1.0000 "application " | Freq: Once | CUTANEOUS | Status: AC
  Administered 2016-12-24: 1 via URETHRAL

## 2016-12-24 NOTE — Progress Notes (Addendum)
12/24/16  CC:  Chief Complaint  Patient presents with  . Cysto    HPI: [name] with vaginal atrophy and a history of microscopic hematuria who presents today for completion of her microscopic hematuria workup.  CT scan was unremarkable.  Background history Patient is [name] who presents today as a referral from their PCP, Dr. Kary Kos, for microscopic hematuria with her daughter, [name].   Patient was found to have microscopic hematuria on 10/15/2016 with 4-10 RBC's/hpf.   She does have a remote history of nephrolithiasis.   she is having symptoms of frequent urination, urgency, dysuria, nocturia and incontinence.  Her UA today is negative.  She is not experiencing any suprapubic pain, abdominal pain or flank pain.   She denies any recent fevers, chills, nausea or vomiting.   She is complaining of vaginal discomfort at this time.  She states she has to put a cold washcloth to the vaginal area at times to relieve the burning and stinging.  This is been occurring for the last several weeks.  She has not seen blood when she wipes on the tissue.  She is not having any vaginal discharge and she is not sexually active.  She has not had any recent imaging studies.   She is a former smoker, with a 1 ppd history.  Quit 31 years ago.  She is not exposed to secondhand smoke.  She worked with fabrics in the past.  She has HTN.   Today, she is still having dysuria, nocturia and leakage.   She is not having suprapubic pain or gross hematuria.  She has not had fevers, chills, nausea or vomiting.  Her UA today is positive for 3-10 RBC's.    She does have history of cervical cancer with vaginal recurrence.  She has received radiation therapy to her vagina for this.  She does not want to pursue any treatments for her incontinence.    She is using the vaginal estrogen cream at this time.      Blood pressure (!) 143/75, pulse 67, height 5\' 5"  (1.651 m), weight 144 lb (65.3  kg). NED. A&Ox3.   No respiratory distress   Abd soft, NT, ND Normal external genitalia with patent urethral meatus  Cystoscopy Procedure Note  Patient identification was confirmed, informed consent was obtained, and patient was prepped using Betadine solution.  Lidocaine jelly was administered per urethral meatus.    Preoperative abx where received prior to procedure.    Procedure: - Flexible cystoscope introduced, without any difficulty.   - Thorough search of the bladder revealed:    normal urethral meatus    normal urothelium    no stones    no ulcers     no tumors    no urethral polyps    no trabeculation Minor changes from radiation noted.  No tumors or malignancies visible.  - Ureteral orifices were normal in position and appearance.  Post-Procedure: - Patient tolerated the procedure well  Assessment/ Plan:  1. Microscopic hematuria             - Negative work up. Repeat urinalysis in one year.  2. Vaginal atrophy             - continue the vaginal cream tid weekly             - She will follow up in three months for an exam with Zara Council, PA.    3.  Incontinence Given the patient's  history, this potentially could be from a vesicovaginal fistula.  However, no further workup of that is not indicated as the patient is not bothered by her incontinence and is not interested in any surgical correction of a possible vesicovaginal fistula.   Nickie Retort, MD

## 2016-12-31 ENCOUNTER — Other Ambulatory Visit: Payer: Medicare Other

## 2017-03-02 DIAGNOSIS — D0310 Melanoma in situ of unspecified eyelid, including canthus: Secondary | ICD-10-CM | POA: Insufficient documentation

## 2017-03-25 ENCOUNTER — Ambulatory Visit: Payer: Medicare Other | Admitting: Urology

## 2018-01-09 DIAGNOSIS — E079 Disorder of thyroid, unspecified: Secondary | ICD-10-CM | POA: Insufficient documentation

## 2018-01-09 DIAGNOSIS — C539 Malignant neoplasm of cervix uteri, unspecified: Secondary | ICD-10-CM | POA: Insufficient documentation

## 2018-01-09 DIAGNOSIS — I878 Other specified disorders of veins: Secondary | ICD-10-CM | POA: Insufficient documentation

## 2018-01-09 NOTE — Progress Notes (Signed)
01/10/2018 3:55 PM   Kristin Rowe 1926/12/29 [id number]  Referring provider: [name], MD [address] [facility] [place], [place] [address]  Chief Complaint  Patient presents with  . Hematuria    HPI: 84 yo WF with vaginal atrophy and a history of hematuria who presents today for stinging and burning in the vaginal area and urethra with her daughters, Butch Penny and [name].   History of hematuria CTU in 11/2016 was negative.  Cystoscopy in 12/2016 noted mild radiation changes.  She does not report any gross hematuria.  Her UA today is positive for 6-10 WBC's and 6-10 RBC's.    Vaginal atrophy Vulva biopsy by gynecology was negative.   Today, she is complaining of burning, stinging and hurting before and after urinating.  She is also having vaginal itching.  She is also complaining of urgency, burning/painful and incontinence.  Patient denies any gross hematuria, dysuria or suprapubic/flank pain.  Patient denies any fevers, chills, nausea or vomiting.   She states the burning occurs when the urine hits her labia and sides of her vaginal walls.  She also has constant vaginal itching that has only been relieved with the clobetasol.     PMH: Past Medical History:  Diagnosis Date  . Anxiety   . Arthritis   . Back pain    chronic  . Cancer [facility])    Vaginal - radiation treatments/mohs for melanoma left eyelid  . COPD (chronic obstructive pulmonary disease) ([place])   . Dizziness    in am  . Heart murmur   . Hypertension   . Hyperthyroidism   . Peripheral vascular disease [facility])     Surgical History: Past Surgical History:  Procedure Laterality Date  . ABDOMINAL HYSTERECTOMY    . ENDARTERECTOMY Right 10/17/2015   Procedure: ENDARTERECTOMY CAROTID;  Surgeon: Algernon Huxley, MD;  Location: [facility] ORS;  Service: Vascular;  Laterality: Right;  . MOHS SURGERY    . vaginectomy     partial    Home Medications:  Allergies as of 01/10/2018      Reactions   Daypro  [oxaprozin] Other (See Comments)   gi   Morphine And Related Nausea And Vomiting   Plaquenil [hydroxychloroquine Sulfate]    Pt denies   Ridaura [auranofin] Other (See Comments)   denies      Medication List       Accurate as of January 10, 2018  3:55 PM. Always use your most recent med list.        ALPRAZolam 0.25 MG tablet Commonly known as:  XANAX Take 0.25 mg by mouth 3 (three) times daily as needed for anxiety.   amLODipine 2.5 MG tablet Commonly known as:  NORVASC Take 2.5 mg by mouth daily. Takes 3 to = 7.5 mg   aspirin EC 81 MG tablet Take 81 mg every other day by mouth.   Clobetasol Prop Emollient Base 0.05 % emollient cream Commonly known as:  CLOBETASOL PROPIONATE E Apply 1 application topically 2 (two) times daily.   COD LIVER OIL PO Take by mouth. Every other day   conjugated estrogens vaginal cream Commonly known as:  PREMARIN Place 1 Applicatorful vaginally daily. Apply 0.5mg  (pea-sized amount)  just inside the vaginal introitus with a finger-tip every night for two weeks and then Monday, Wednesday and Friday nights.   conjugated estrogens vaginal cream Commonly known as:  PREMARIN Place 1 Applicatorful vaginally daily. Apply 0.5mg  (pea-sized amount)  just inside the vaginal introitus with a  finger-tip on  Monday, Wednesday and Friday nights.   lisinopril 20 MG tablet Commonly known as:  PRINIVIL,ZESTRIL Take 20 mg by mouth daily.   methimazole 5 MG tablet Commonly known as:  TAPAZOLE Take 5 mg by mouth daily.   metoprolol tartrate 100 MG tablet Commonly known as:  LOPRESSOR Take 100 mg by mouth.       Allergies:  Allergies  Allergen Reactions  . Daypro [Oxaprozin] Other (See Comments)    gi  . Morphine And Related Nausea And Vomiting  . Plaquenil [Hydroxychloroquine Sulfate]     Pt denies  . Ridaura [Auranofin] Other (See Comments)    denies    Family History: Family History  Problem Relation Age of Onset  . Kidney disease  Sister   . Kidney Stones Sister   . Kidney cancer Neg Hx   . Prostate cancer Neg Hx     Social History:  reports that she quit smoking about 32 years ago. She has never used smokeless tobacco. She reports that she does not drink alcohol or use drugs.  ROS: UROLOGY Frequent Urination?: No Hard to postpone urination?: Yes Burning/pain with urination?: Yes Get up at night to urinate?: No Leakage of urine?: Yes Urine stream starts and stops?: No Trouble starting stream?: No Do you have to strain to urinate?: No Blood in urine?: No Urinary tract infection?: No Sexually transmitted disease?: No Injury to kidneys or bladder?: No Painful intercourse?: No Weak stream?: No Currently pregnant?: No Vaginal bleeding?: No Last menstrual period?: n  Gastrointestinal Nausea?: No Vomiting?: No Indigestion/heartburn?: No Diarrhea?: No Constipation?: No  Constitutional Fever: No Night sweats?: No Weight loss?: Yes Fatigue?: Yes  Skin Skin rash/lesions?: No Itching?: No  Eyes Blurred vision?: No Double vision?: No  Ears/Nose/Throat Sore throat?: No Sinus problems?: No  Hematologic/Lymphatic Swollen glands?: No Easy bruising?: No  Cardiovascular Leg swelling?: No Chest pain?: No  Respiratory Cough?: No Shortness of breath?: No  Endocrine Excessive thirst?: No  Musculoskeletal Back pain?: No Joint pain?: No  Neurological Headaches?: No Dizziness?: No  Psychologic Depression?: No Anxiety?: No  Physical Exam: BP 137/77   Pulse 77   Ht 5\' 5"  (1.651 m)   Wt 135 lb (61.2 kg)   BMI 22.47 kg/m   Constitutional:  Well nourished. Alert and oriented, No acute distress. HEENT: [place] AT, moist mucus membranes.  Trachea midline, no masses.  Left eye with entropion.  Cardiovascular: No clubbing, cyanosis, or edema. Respiratory: Normal respiratory effort, no increased work of breathing. GI: Abdomen is soft, non tender, non distended, no abdominal masses. Liver and  spleen not palpable.  No hernias appreciated.  Stool sample for occult testing is not indicated.   GU: No CVA tenderness.  No bladder fullness or masses.  Atrophic external genitalia, sparse pubic hair distribution, no lesions.  Normal urethral meatus, no lesions, no prolapse, no discharge.   No urethral masses, tenderness and/or tenderness.  Areas of lichen sclerosis seen through the introitus.  No bladder fullness, tenderness or masses. Pale vagina mucosa, poor estrogen effect, no discharge, no lesions, good pelvic support, no cystocele and norectocele noted.  Cervix and uterus are surgically absent.  No adnexal/parametria masses or tenderness noted.  Anus and perineum are without rashes or lesions.   Skin: No rashes, bruises or suspicious lesions. Lymph: No cervical or inguinal adenopathy. Neurologic: Grossly intact, no focal deficits, moving all 4 extremities. Psychiatric: Normal mood and affect.   Laboratory Data: Urinalysis 6-10 RBC's and 6-10 WBC's.  See EPIC.  I have reviewed the labs.  Assessment & Plan:    1. History of hematuria Hematuria work up completed in 12/2016 - NED No report of gross hematuria  UA today positive for 6-10 RBC's - will send for culture  2. Vaginal atrophy Restart the vaginal cream tid weekly She will follow up in two weeks for symptom recheck and to ensure compliance  3. Lichen sclerosis Continue clobetasol bid x two weeks as she is finding it helpful in relieving her vulvar pruritis   4. Vulvar pruritis Advised patient to wash clothing that will be in contact with her perineum with Dreft detergent, not to wash vaginal area so frequently and apply zinc oxide containing barrier creams liberally to help with symptoms RTC in 2 weeks to see if these steps are working for her    Return in about 2 weeks (around 01/24/2018) for follow up .  These notes generated with voice recognition software. I apologize for typographical errors.  Zara Council,  PA-C  [facility] [address] [place], [place] [address] [phone]

## 2018-01-10 ENCOUNTER — Ambulatory Visit (INDEPENDENT_AMBULATORY_CARE_PROVIDER_SITE_OTHER): Payer: Medicare Other | Admitting: Urology

## 2018-01-10 ENCOUNTER — Encounter: Payer: Self-pay | Admitting: Urology

## 2018-01-10 ENCOUNTER — Other Ambulatory Visit
Admission: RE | Admit: 2018-01-10 | Discharge: 2018-01-10 | Disposition: A | Discharge status: Home / Self Care | Payer: Medicare Other | Attending: Urology | Admitting: Urology

## 2018-01-10 VITALS — BP 137/77 | HR 77 | Ht 65.0 in | Wt 135.0 lb

## 2018-01-10 DIAGNOSIS — N952 Postmenopausal atrophic vaginitis: Secondary | ICD-10-CM

## 2018-01-10 DIAGNOSIS — Z87448 Personal history of other diseases of urinary system: Secondary | ICD-10-CM | POA: Diagnosis present

## 2018-01-10 DIAGNOSIS — L292 Pruritus vulvae: Secondary | ICD-10-CM | POA: Diagnosis not present

## 2018-01-10 DIAGNOSIS — L900 Lichen sclerosus et atrophicus: Secondary | ICD-10-CM | POA: Diagnosis not present

## 2018-01-10 LAB — URINALYSIS, COMPLETE (UACMP) WITH MICROSCOPIC
BILIRUBIN URINE: NEGATIVE
GLUCOSE, UA: NEGATIVE mg/dL
KETONES UR: NEGATIVE mg/dL
Leukocytes, UA: NEGATIVE
NITRITE: NEGATIVE
PROTEIN: 30 mg/dL — AB
Specific Gravity, Urine: 1.025 (ref 1.005–1.030)
pH: 5.5 (ref 5.0–8.0)

## 2018-01-10 LAB — BLADDER SCAN AMB NON-IMAGING

## 2018-01-10 MED ORDER — CLOBETASOL PROP EMOLLIENT BASE 0.05 % EX CREA: 1.0000 "application " | TOPICAL_CREAM | Freq: Two times a day (BID) | CUTANEOUS | 0 refills | Status: DC

## 2018-01-10 MED ORDER — ESTROGENS, CONJUGATED 0.625 MG/GM VA CREA: 1.0000 | TOPICAL_CREAM | Freq: Every day | VAGINAL | 12 refills | Status: DC

## 2018-01-10 NOTE — Patient Instructions (Signed)
  Using your finger-tip apply a pea-sized amount of the Premarin cream to the external vaginal area on Monday, Wednesday and Friday nights  Using your finger-tip apply the clobetasol cream twice daily as needed  Apply the A & D ointment liberally   Use Dreft detergent for items worn around her bottom

## 2018-01-12 LAB — URINE CULTURE

## 2018-01-30 NOTE — Progress Notes (Signed)
01/31/2018 4:20 PM   Kristin Rowe 1926/06/27 [id number]  Referring provider: [name], MD [address] [facility] [place], [name] [address]  Chief Complaint  Patient presents with  . Follow-up    HPI: 85 yo WF with vaginal atrophy and a history of hematuria who presents today for follow up for stinging and burning in the vaginal area and urethra with her daughters, Butch Penny and [name].   History of hematuria CTU in 11/2016 was negative.  Cystoscopy in 12/2016 noted mild radiation changes.  She does not report any gross hematuria.  Her UA was positive for 6-10 WBC's and 6-10 RBC's.  Urine culture was negative.   Vaginal atrophy Vulva biopsy by gynecology was negative.   Today, she states the vaginal burning has improved immensely.  She is now able to sleep through the night.   She is using the vaginal estrogen cream on Monday, Wednesday and Friday nights.  She is using the clobetasol cream BID on Sunday, Tuesday, Thursday and Saturdays.  She is using the lidocaine gel prn.  Patient denies any gross hematuria, dysuria or suprapubic/flank pain.  Patient denies any fevers, chills, nausea or vomiting.    She continues to have baseline urgency, nocturia and incontinence.  This is not bothersome to him at this time.     PMH: Past Medical History:  Diagnosis Date  . Anxiety   . Arthritis   . Back pain    chronic  . 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 COPD (chronic obstructive pulmonary disease) ([place])   . Dizziness    in am  . Heart murmur   . Hypertension   . Hyperthyroidism   . Peripheral vascular disease [facility])     Surgical History: Past Surgical History:  Procedure Laterality Date  . ABDOMINAL HYSTERECTOMY    . ENDARTERECTOMY Right 10/17/2015   Procedure: ENDARTERECTOMY CAROTID;  Surgeon: Algernon Huxley, MD;  Location: [facility] ORS;  Service: Vascular;  Laterality: Right;  . MOHS SURGERY    . vaginectomy      partial    Home Medications:  Allergies as of 01/31/2018      Reactions   Daypro [oxaprozin] Other (See Comments)   gi   Morphine And Related Nausea And Vomiting   Plaquenil [hydroxychloroquine Sulfate]    Pt denies   Ridaura [auranofin] Other (See Comments)   denies      Medication List       Accurate as of January 31, 2018  4:20 PM. Always use your most recent med list.        ALPRAZolam 0.25 MG tablet Commonly known as:  XANAX Take 0.25 mg by mouth 3 (three) times daily as needed for anxiety.   amLODipine 2.5 MG tablet Commonly known as:  NORVASC Take 2.5 mg by mouth daily. Takes 3 to = 7.5 mg   aspirin EC 81 MG tablet Take 81 mg every other day by mouth.   Clobetasol Prop Emollient Base 0.05 % emollient cream Commonly known as:  CLOBETASOL PROPIONATE E Apply 1 application topically 2 (two) times daily.   COD LIVER OIL PO Take by mouth. Every other day   conjugated estrogens vaginal cream Commonly known as:  PREMARIN Place 1 Applicatorful vaginally daily. Apply 0.5mg  (pea-sized amount)  just inside the vaginal introitus with a finger-tip every night for two weeks and then Monday, Wednesday and Friday nights.   conjugated estrogens vaginal cream Commonly known  as:  PREMARIN Place 1 Applicatorful vaginally daily. Apply 0.5mg  (pea-sized amount)  just inside the vaginal introitus with a finger-tip on  Monday, Wednesday and Friday nights.   Lidocaine 2 % Gel Apply 1 Act topically as needed.   lisinopril 20 MG tablet Commonly known as:  PRINIVIL,ZESTRIL Take 20 mg by mouth daily.   methimazole 5 MG tablet Commonly known as:  TAPAZOLE Take 5 mg by mouth daily.   metoprolol tartrate 100 MG tablet Commonly known as:  LOPRESSOR Take 100 mg by mouth.   mirtazapine 15 MG tablet Commonly known as:  REMERON Take by mouth.       Allergies:  Allergies  Allergen Reactions  . Daypro [Oxaprozin] Other (See Comments)    gi  . Morphine And Related Nausea And  Vomiting  . Plaquenil [Hydroxychloroquine Sulfate]     Pt denies  . Ridaura [Auranofin] Other (See Comments)    denies    Family History: Family History  Problem Relation Age of Onset  . Kidney disease Sister   . Kidney Stones Sister   . Kidney cancer Neg Hx   . Prostate cancer Neg Hx     Social History:  reports that she quit smoking about 32 years ago. She has never used smokeless tobacco. She reports that she does not drink alcohol or use drugs.  ROS: UROLOGY Frequent Urination?: Yes Hard to postpone urination?: Yes Burning/pain with urination?: Yes Get up at night to urinate?: Yes Leakage of urine?: No Urine stream starts and stops?: No Trouble starting stream?: No Do you have to strain to urinate?: No Blood in urine?: No Urinary tract infection?: No Sexually transmitted disease?: No Injury to kidneys or bladder?: No Painful intercourse?: No Weak stream?: No Currently pregnant?: No Vaginal bleeding?: No Last menstrual period?: n  Gastrointestinal Nausea?: No Vomiting?: No Indigestion/heartburn?: No Diarrhea?: No Constipation?: No  Constitutional Fever: No Night sweats?: No Weight loss?: No Fatigue?: No  Skin Skin rash/lesions?: No Itching?: No  Eyes Blurred vision?: No Double vision?: No  Ears/Nose/Throat Sore throat?: No Sinus problems?: No  Hematologic/Lymphatic Swollen glands?: No Easy bruising?: No  Cardiovascular Leg swelling?: No Chest pain?: No  Respiratory Cough?: No Shortness of breath?: No  Endocrine Excessive thirst?: No  Musculoskeletal Back pain?: No Joint pain?: No  Neurological Headaches?: No Dizziness?: No  Psychologic Depression?: No Anxiety?: No  Physical Exam: BP (!) 154/72   Pulse 60   Temp (!) 96.4 F (35.8 C) (Oral)   Resp 16   Ht 5\' 5"  (1.651 m)   Wt 132 lb 8 oz (60.1 kg)   BMI 22.05 kg/m   Constitutional:  Well nourished. Alert and oriented, No acute distress. HEENT: [place] AT, moist mucus  membranes.  Trachea midline, no masses. Cardiovascular: No clubbing, cyanosis, or edema. Respiratory: Normal respiratory effort, no increased work of breathing. Skin: No rashes, bruises or suspicious lesions. Neurologic: Grossly intact, no focal deficits, moving all 4 extremities. Psychiatric: Normal mood and affect.   Assessment & Plan:    1. History of hematuria Hematuria work up completed in 12/2016 - NED No report of gross hematuria   2. Vaginal atrophy Continue the vaginal cream tid weekly She will follow up in three months for symptom recheck and to ensure compliance  3. Lichen sclerosis Continue clobetasol, but reduce it to once on Saturday, Sunday, Tuesday and Thursday as it is helpful in relieving her vulvar pruritis   4. Vulvar pruritis Resolving Using lidocaine prn     Return in  about 3 months (around 05/02/2018) for OAB questionnaire, PVR and exam.  These notes generated with voice recognition software. I apologize for typographical errors.  Zara Council, PA-C  [facility] [address] [place], [place] [address] [phone]

## 2018-01-31 ENCOUNTER — Encounter: Payer: Self-pay | Admitting: Urology

## 2018-01-31 ENCOUNTER — Ambulatory Visit: Payer: Medicare Other | Admitting: Urology

## 2018-01-31 ENCOUNTER — Other Ambulatory Visit: Payer: Self-pay

## 2018-01-31 VITALS — BP 154/72 | HR 60 | Temp 96.4°F | Resp 16 | Ht 65.0 in | Wt 132.5 lb

## 2018-01-31 DIAGNOSIS — L900 Lichen sclerosus et atrophicus: Secondary | ICD-10-CM

## 2018-01-31 DIAGNOSIS — L292 Pruritus vulvae: Secondary | ICD-10-CM | POA: Diagnosis not present

## 2018-01-31 DIAGNOSIS — N952 Postmenopausal atrophic vaginitis: Secondary | ICD-10-CM

## 2018-01-31 DIAGNOSIS — Z87448 Personal history of other diseases of urinary system: Secondary | ICD-10-CM

## 2018-01-31 MED ORDER — CLOBETASOL PROP EMOLLIENT BASE 0.05 % EX CREA: 1.0000 "application " | TOPICAL_CREAM | Freq: Two times a day (BID) | CUTANEOUS | 0 refills | Status: DC

## 2018-01-31 MED ORDER — LIDOCAINE 2 % EX GEL: 1.0000 | CUTANEOUS | 1 refills | Status: DC | PRN

## 2018-02-16 ENCOUNTER — Ambulatory Visit: Payer: Medicare Other | Admitting: Urology

## 2018-05-03 ENCOUNTER — Telehealth: Payer: Medicare Other | Admitting: Urology

## 2018-06-30 ENCOUNTER — Other Ambulatory Visit: Payer: Self-pay

## 2018-06-30 ENCOUNTER — Telehealth: Payer: Self-pay | Admitting: Urology

## 2018-06-30 ENCOUNTER — Telehealth (INDEPENDENT_AMBULATORY_CARE_PROVIDER_SITE_OTHER): Payer: Medicare Other | Admitting: Urology

## 2018-06-30 DIAGNOSIS — Z87448 Personal history of other diseases of urinary system: Secondary | ICD-10-CM | POA: Diagnosis not present

## 2018-06-30 DIAGNOSIS — L292 Pruritus vulvae: Secondary | ICD-10-CM

## 2018-06-30 DIAGNOSIS — N952 Postmenopausal atrophic vaginitis: Secondary | ICD-10-CM

## 2018-06-30 DIAGNOSIS — L900 Lichen sclerosus et atrophicus: Secondary | ICD-10-CM

## 2018-06-30 MED ORDER — LIDOCAINE 2 % EX GEL
1.0000 | CUTANEOUS | 1 refills | Status: DC | PRN
Start: 2018-06-30 — End: 2019-02-13

## 2018-06-30 NOTE — Telephone Encounter (Signed)
I know that we will probably be seeing people full time in the office in 3 weeks, but would you put Kristin Rowe on the schedule during lunchtime in three weeks as a telephone visit to help remind me to call her in three weeks?

## 2018-06-30 NOTE — Progress Notes (Signed)
Virtual Visit via Telephone Note  I connected with Kristin Rowe on 06/30/2018 at 1330 by telephone and verified that I am speaking with the correct person using two identifiers.  They are located at home  I am located at my home.  I spoke with her daughter, Kristin Rowe, mostly as [name] is very hard of healing.   This visit type was conducted due to national recommendations for restrictions regarding the COVID-19 Pandemic (e.g. social distancing).  This format is felt to be most appropriate for this patient at this time.  All issues noted in this document were discussed and addressed.  No physical exam was performed.   I discussed the limitations, risks, security and privacy concerns of performing an evaluation and management service by telephone and the availability of in person appointments. I also discussed with the patient that there may be a patient responsible charge related to this service. The patient expressed understanding and agreed to proceed.   History of Present Illness: [name] is an 85 year old female with a history of hematuria and vaginal atrophy who is contacted today via telephone for a follow up visit.   History of hematuria - high risk.  Former smoker. CTU in [id number] negative.  Cysto in 12/2016 with mild radiation changes.  No reports of gross hematuria.  Patient denies any dysuria or suprapubic/flank pain.  Patient denies any fevers, chills, nausea or vomiting.   Vaginal atrophy/lichen sclerosis/vulvar pruritis - she is still applying the vaginal estrogen cream, clobetasol and lidocaine gel for her vaginal burning.   She still is experiencing vaginal discomfort, but it has decreased and manageable at this time.    Observations/Objective: [name] is very hard of hearing, but she answers questions appropriately.  She does not sound distressed.   Assessment and Plan:  1. History of hematuria (high risk) No reports of gross hematuria  2. Vaginal  atrophy Exacerbated by lichen sclerosis Refill for the lidocaine gel given, advised to to use more than 0.5 mL a day as it can be systemically absorbed and cause heart arrhthymias    Follow Up Instructions:  [name] will be contacted via telephone for a telehealth visit in 3 weeks.     I discussed the assessment and treatment plan with the patient. The patient was provided an opportunity to ask questions and all were answered. The patient agreed with the plan and demonstrated an understanding of the instructions.   The patient was advised to call back or seek an in-person evaluation if the symptoms worsen or if the condition fails to improve as anticipated.  I provided 14 minutes of non-face-to-face time during this encounter.   Adreyan Carbajal, PA-C

## 2018-07-21 ENCOUNTER — Telehealth: Payer: Medicare Other | Admitting: Urology

## 2019-01-26 IMAGING — CT CT ABD-PEL WO/W CM
2 of 6 series · 13 of 32 positions shown, 18 images · IV contrast (APPLIED)
Comparison: None.

CLINICAL DATA: Microscopic hematuria. Dysuria. Nephrolithiasis.
Personal history of vaginal carcinoma and melanoma.

EXAM:
CT ABDOMEN AND PELVIS WITHOUT AND WITH CONTRAST
TECHNIQUE: Multidetector CT imaging of the abdomen and pelvis was performed
following the standard protocol before and following the bolus
administration of intravenous contrast.
CONTRAST:  125mL 1GFNOL-WLL IOPAMIDOL (1GFNOL-WLL) INJECTION 61%

[Series 2: axial pre · axial · non-contrast · 0.70mm/px · z∈[-1006,-706]mm · 6 of 86 slices shown]
[im 13/86  soft-tissue]
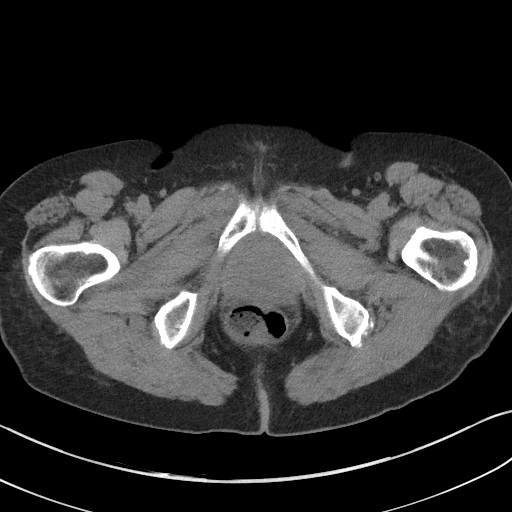
[im 25/86  soft-tissue]
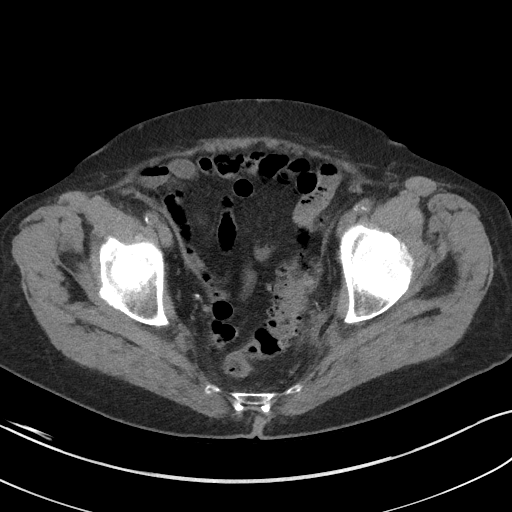
[im 37/86  soft-tissue]
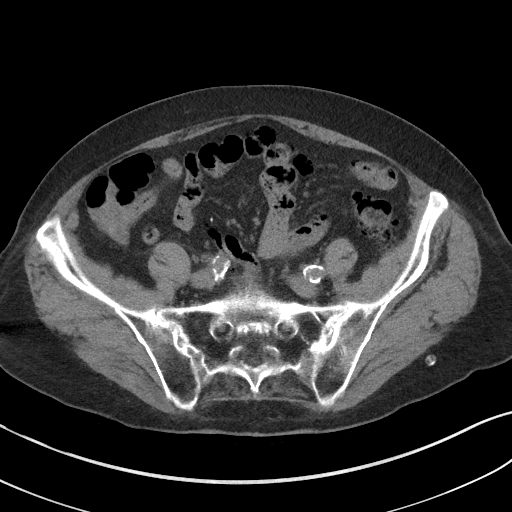
[im 49/86  soft-tissue]
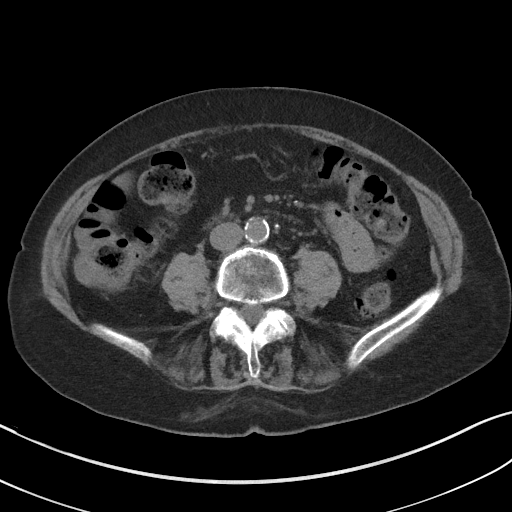
[im 61/86  soft-tissue]
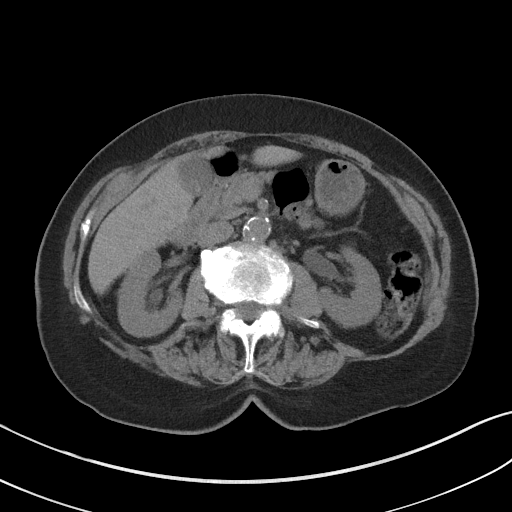
[im 73/86  soft-tissue]
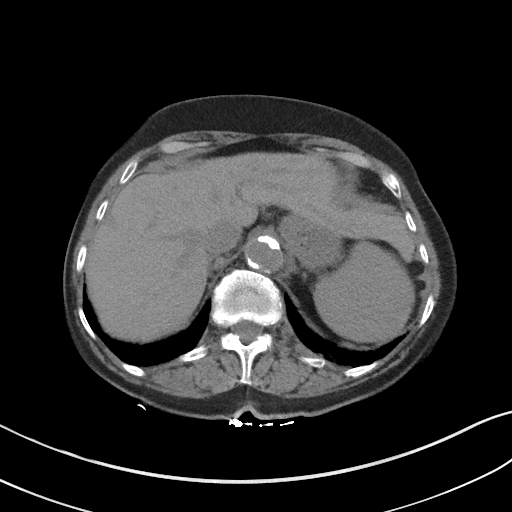

[Series 10: axial delay · axial · delayed · 0.70mm/px · z∈[-951,-611]mm · 7 of 92 slices shown, 12 images]
[im 12/92  soft-tissue]
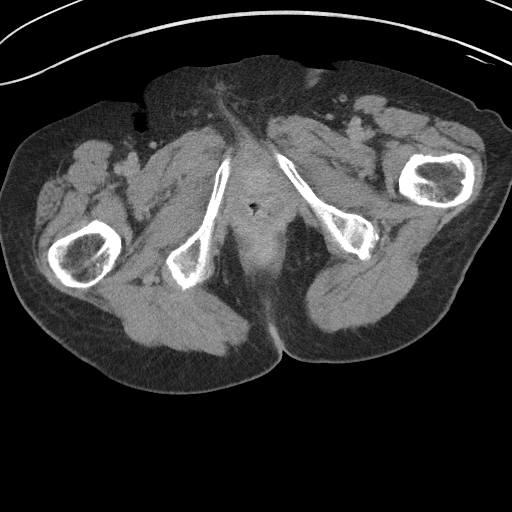
[im 12/92  bone]
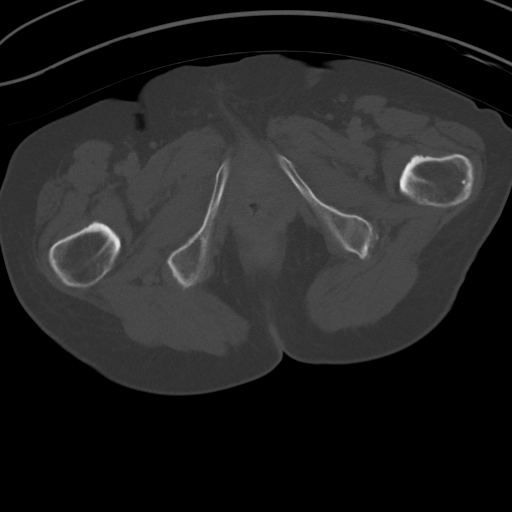
[im 23/92  soft-tissue]
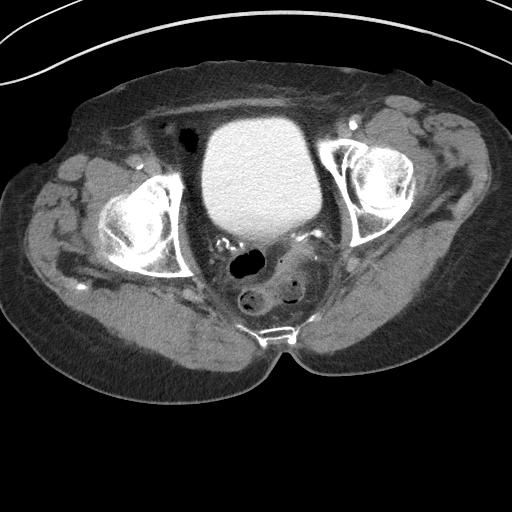
[im 35/92  soft-tissue]
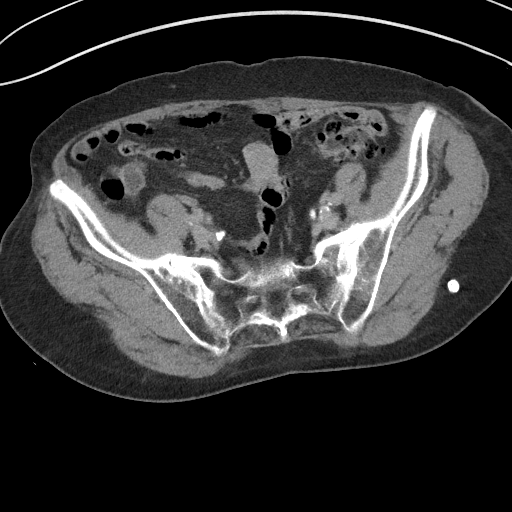
[im 46/92  soft-tissue]
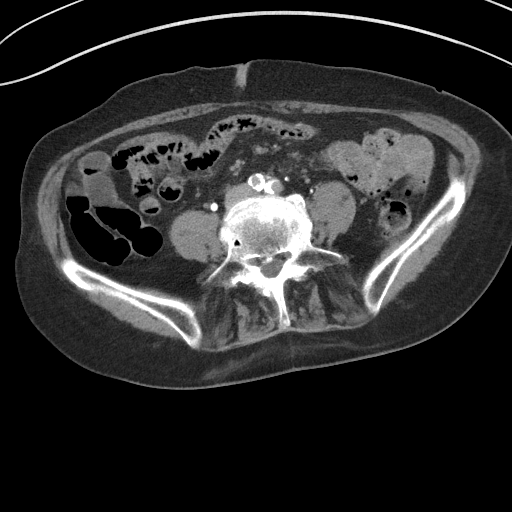
[im 46/92  lung]
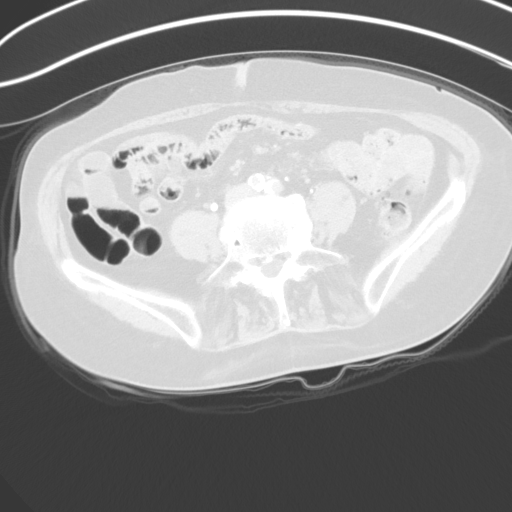
[im 57/92  soft-tissue]
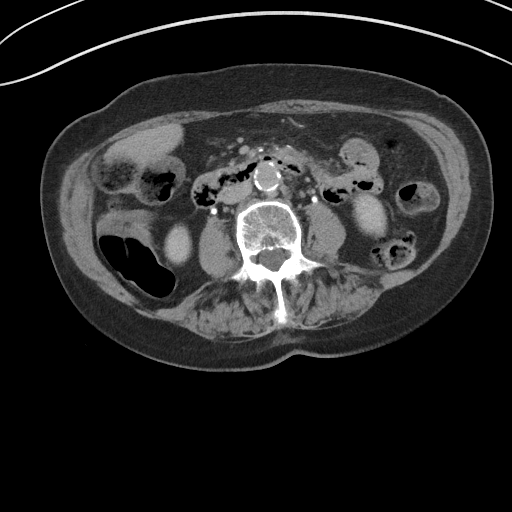
[im 57/92  lung]
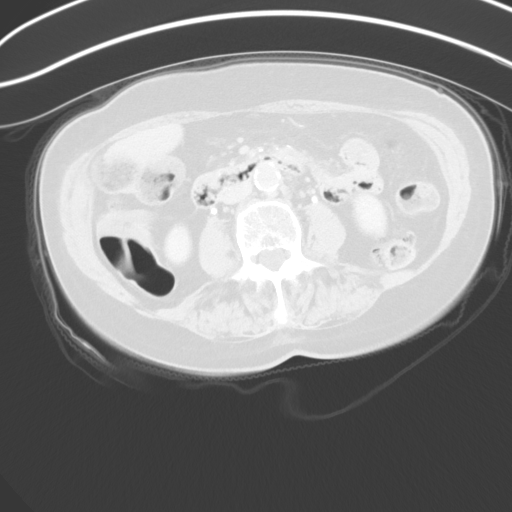
[im 69/92  soft-tissue]
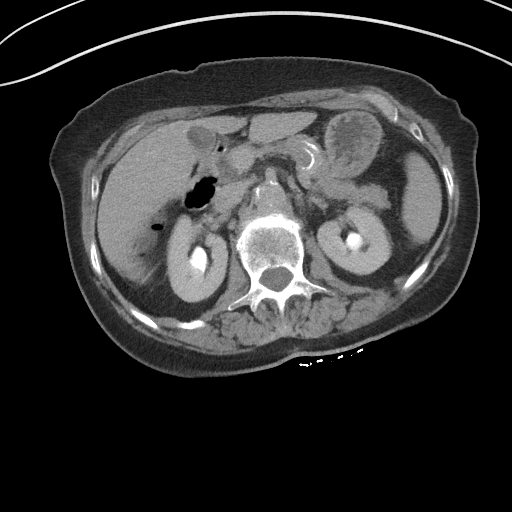
[im 69/92  lung]
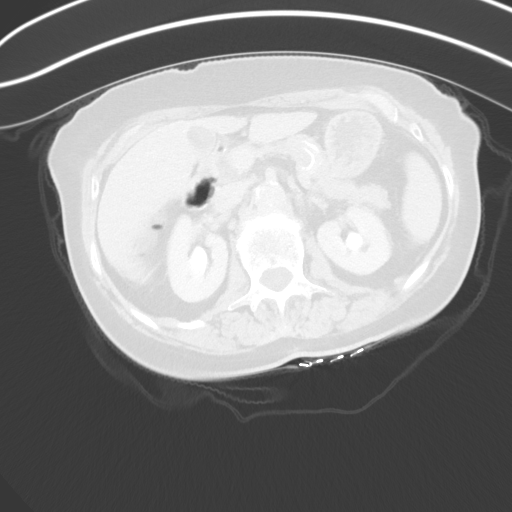
[im 80/92  soft-tissue]
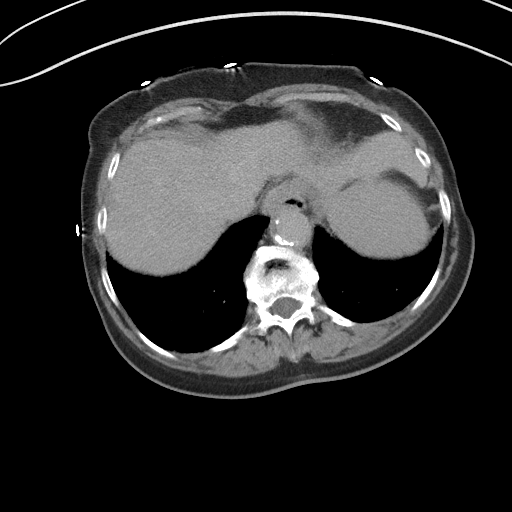
[im 80/92  lung]
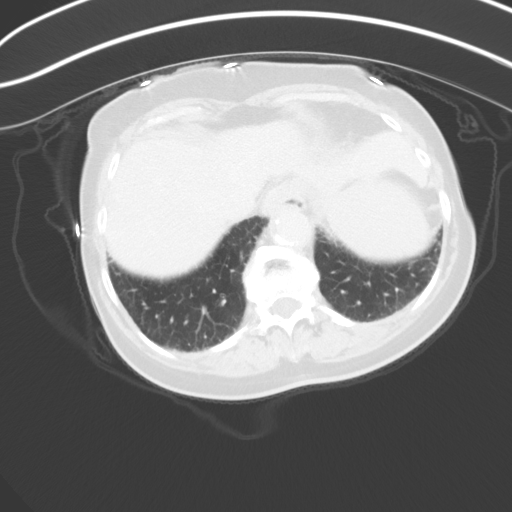

[13 of 32 positions shown; findings below may reference images not displayed]

FINDINGS: Lower Chest: No acute findings.

Hepatobiliary: No hepatic masses identified. Several cholesterol
gallstones are seen, without evidence of cholecystitis or biliary
dilatation.

Pancreas:  No mass or inflammatory changes.

Spleen: Within normal limits in size and appearance.

Adrenals/Urinary Tract: No adrenal masses identified. No evidence of
urolithiasis or hydronephrosis. No complex cystic or solid renal
masses identified. No masses seen involving the ureters or bladder.

Stomach/Bowel: No evidence of obstruction, inflammatory process or
abnormal fluid collections. Normal appendix visualized.
Diverticulosis of the descending and sigmoid colon, without evidence
of diverticulitis.

Vascular/Lymphatic: No pathologically enlarged lymph nodes. No
abdominal aortic aneurysm. Aortic atherosclerosis.

Reproductive: Prior hysterectomy noted. Adnexal regions are
unremarkable in appearance.

Other:  None.

Musculoskeletal:  No suspicious bone lesions identified.
IMPRESSION: No radiographic evidence of urinary tract neoplasm, urolithiasis, or
hydronephrosis.

Cholelithiasis.  No radiographic evidence of cholecystitis.

Colonic diverticulosis, without radiographic evidence of
diverticulitis.

Aortic atherosclerosis.

## 2019-02-10 ENCOUNTER — Telehealth: Payer: Self-pay | Admitting: Urology

## 2019-02-10 NOTE — Telephone Encounter (Signed)
Pt's daughter [facility] and requests a refill for Lidocaine 2% called into [facility] in [place].

## 2019-02-13 MED ORDER — LIDOCAINE 2 % EX GEL: 1.0000 | CUTANEOUS | 1 refills | Status: DC | PRN

## 2019-02-13 NOTE — Telephone Encounter (Signed)
That is fine to refill the Lidocaine gel.

## 2019-07-24 ENCOUNTER — Other Ambulatory Visit: Payer: Self-pay | Admitting: Urology

## 2019-08-08 ENCOUNTER — Encounter: Payer: Self-pay | Admitting: Urology

## 2019-08-08 ENCOUNTER — Ambulatory Visit: Payer: Medicare Other | Admitting: Urology

## 2019-08-08 ENCOUNTER — Other Ambulatory Visit: Payer: Self-pay

## 2019-08-08 VITALS — BP 151/70 | HR 60 | Ht 65.0 in | Wt 117.5 lb

## 2019-08-08 DIAGNOSIS — L900 Lichen sclerosus et atrophicus: Secondary | ICD-10-CM | POA: Diagnosis not present

## 2019-08-08 DIAGNOSIS — N952 Postmenopausal atrophic vaginitis: Secondary | ICD-10-CM | POA: Diagnosis not present

## 2019-08-08 DIAGNOSIS — L292 Pruritus vulvae: Secondary | ICD-10-CM

## 2019-08-08 DIAGNOSIS — Z87448 Personal history of other diseases of urinary system: Secondary | ICD-10-CM | POA: Diagnosis not present

## 2019-08-08 MED ORDER — PREMARIN 0.625 MG/GM VA CREA: 1.0000 | TOPICAL_CREAM | Freq: Every day | VAGINAL | 12 refills | Status: AC

## 2019-08-08 MED ORDER — CLOBETASOL PROP EMOLLIENT BASE 0.05 % EX CREA: 1.0000 "application " | TOPICAL_CREAM | Freq: Two times a day (BID) | CUTANEOUS | 11 refills | Status: AC

## 2019-08-08 MED ORDER — LIDOCAINE 2 % EX GEL
1.0000 | CUTANEOUS | 5 refills | Status: DC | PRN
Start: 2019-08-08 — End: 2019-08-30

## 2019-08-08 NOTE — Progress Notes (Signed)
01/31/2018 3:48 PM   Willette Brace Apr 18, 1926 [id number]  Referring provider: [name], MD [address] [facility] [place],  [place] [address]  Chief Complaint  Patient presents with  . Follow-up    HPI: 86 yo WF with vaginal atrophy, also lichen sclerosis and a history of hematuria who presents today for stinging and burning in the vaginal area and urethra with her daughter [name].   History of hematuria CTU in 11/2016 was negative.  Cystoscopy in 12/2016 noted mild radiation changes.  She does not report any gross hematuria.  Vaginal atrophy Vulva biopsy by gynecology was negative.   Today, she states the vaginal burning has worsened immensely. She is using the vaginal estrogen cream on Monday, Wednesday and Friday nights.  She is using the lidocaine gel prn.  She frequently pressing a cold wash cloth to her vaginal to ease the discomfort.   She continues to have baseline urgency, nocturia and incontinence.  This is not bothersome to her at this time.    Patient denies any modifying or aggravating factors.  Patient denies any gross hematuria, dysuria or suprapubic/flank pain.  Patient denies any fevers, chills, nausea or vomiting.    PMH: Past Medical History:  Diagnosis Date  . Anxiety   . Arthritis   . Back pain    chronic  . Cancer [facility])    Vaginal - radiation treatments/mohs for melanoma left eyelid  . COPD (chronic obstructive pulmonary disease) ([place])   . Dizziness    in am  . Heart murmur   . Hypertension   . Hyperthyroidism   . Peripheral vascular disease [facility])     Surgical History: Past Surgical History:  Procedure Laterality Date  . ABDOMINAL HYSTERECTOMY    . ENDARTERECTOMY Right 10/17/2015   Procedure: ENDARTERECTOMY CAROTID;  Surgeon: Algernon Huxley, MD;  Location: [facility] ORS;  Service: Vascular;  Laterality: Right;  . MOHS SURGERY    . vaginectomy     partial    Home Medications:  Allergies as of 08/08/2019      Reactions    Daypro [oxaprozin] Other (See Comments)   gi   Morphine And Related Nausea And Vomiting   Plaquenil [hydroxychloroquine Sulfate]    Pt denies   Ridaura [auranofin] Other (See Comments)   denies      Medication List       Accurate as of August 08, 2019  3:48 PM. If you have any questions, ask your nurse or doctor.        ALPRAZolam 0.25 MG tablet Commonly known as: XANAX Take 0.25 mg by mouth 3 (three) times daily as needed for anxiety.   amLODipine 2.5 MG tablet Commonly known as: NORVASC Take 2.5 mg by mouth daily. Takes 3 to = 7.5 mg   aspirin EC 81 MG tablet Take 81 mg every other day by mouth.   Clobetasol Prop Emollient Base 0.05 % emollient cream Commonly known as: Clobetasol Propionate E Apply 1 application topically 2 (two) times daily.   COD LIVER OIL PO Take by mouth. Every other day   conjugated estrogens vaginal cream Commonly known as: Premarin Place 1 Applicatorful vaginally daily. Apply 0.5mg  (pea-sized amount)  just inside the vaginal introitus with a finger-tip every night for two weeks and then Monday, Wednesday and Friday nights.   conjugated estrogens vaginal cream Commonly known as: Premarin Place 1 Applicatorful vaginally daily. Apply 0.5mg  (pea-sized amount)  just inside the vaginal introitus with a finger-tip on  Monday, Wednesday and Friday nights.   Lidocaine 2 % Gel Apply 1 Act topically as needed.   lisinopril 20 MG tablet Commonly known as: ZESTRIL Take 20 mg by mouth daily.   methimazole 5 MG tablet Commonly known as: TAPAZOLE Take 5 mg by mouth daily.   metoprolol tartrate 100 MG tablet Commonly known as: LOPRESSOR Take 100 mg by mouth.   mirtazapine 15 MG tablet Commonly known as: REMERON Take by mouth.       Allergies:  Allergies  Allergen Reactions  . Daypro [Oxaprozin] Other (See Comments)    gi  . Morphine And Related Nausea And Vomiting  . Plaquenil [Hydroxychloroquine Sulfate]     Pt denies  . Ridaura  [Auranofin] Other (See Comments)    denies    Family History: Family History  Problem Relation Age of Onset  . Kidney disease Sister   . Kidney Stones Sister   . Kidney cancer Neg Hx   . Prostate cancer Neg Hx     Social History:  reports that she quit smoking about 33 years ago. She has never used smokeless tobacco. She reports that she does not drink alcohol and does not use drugs.  ROS: For pertinent review of systems please refer to history of present illness  Physical Exam: BP (!) 151/70   Pulse 60   Ht 5\' 5"  (1.651 m)   Wt 117 lb 8 oz (53.3 kg)   BMI 19.55 kg/m   Constitutional:  Well nourished. Alert and oriented, No acute distress. HEENT: [place] AT, mask in place.  Trachea midline Cardiovascular: No clubbing, cyanosis, or edema. Respiratory: Normal respiratory effort, no increased work of breathing. GI: Abdomen is soft, non tender, non distended, no abdominal masses. Liver and spleen not palpable.  No hernias appreciated.  Stool sample for occult testing is not indicated.   GU: No CVA tenderness.  No bladder fullness or masses.  Atrophic external genitalia, pale, thickened vaginal mucosa at the 12 o'clock position in the introitus that is tender to palpation, sparse pubic hair distribution, no lesions.  Normal urethral meatus, no lesions, no prolapse, no discharge.   No urethral masses, tenderness and/or tenderness. No bladder fullness, tenderness or masses. Pale vagina mucosa, fair estrogen effect, no discharge, no lesions, fair pelvic support,  Grade II cystocele and no rectocele noted.  Cervix, uterus and adnexa are surgically absent.   Anus and perineum are without rashes or lesions.    Skin: No rashes, bruises or suspicious lesions. Lymph: No inguinal adenopathy. Neurologic: Grossly intact, no focal deficits, moving all 4 extremities. Psychiatric: Normal mood and affect.   Assessment & Plan:    1. History of hematuria Hematuria work up completed in 12/2016 - NED No  report of gross hematuria   2. Vaginal atrophy Continue the vaginal cream tid weekly - refills given She will follow up in three weeks for symptom recheck   3. Lichen sclerosis Restart clobetasol BID daily - refills given She will follow up in three weeks for symptom recheck   4. Vulvar pruritis Using lidocaine prn - refills given  She will follow up in 3 weeks for symptom recheck  These notes generated with voice recognition software. I apologize for typographical errors.  [place] [address] [place], [place] [address] [phone]   I, Joneen Boers Peace, am acting as a Education administrator for [employer], PA-C  I have reviewed the above documentation for accuracy and completeness, and I agree with the above.  Zara Council, PA-C

## 2019-08-28 ENCOUNTER — Other Ambulatory Visit: Payer: Self-pay | Admitting: Student

## 2019-08-28 DIAGNOSIS — Z87891 Personal history of nicotine dependence: Secondary | ICD-10-CM

## 2019-08-28 DIAGNOSIS — R630 Anorexia: Secondary | ICD-10-CM

## 2019-08-28 DIAGNOSIS — R634 Abnormal weight loss: Secondary | ICD-10-CM

## 2019-08-28 DIAGNOSIS — K5900 Constipation, unspecified: Secondary | ICD-10-CM

## 2019-08-29 NOTE — Progress Notes (Signed)
01/31/2018 2:41 PM   Kristin Rowe 20-Nov-1926 [id number]  Referring provider: [name], MD [address] [facility] [place],  [place] [address]  Chief Complaint  Patient presents with  . Follow-up    HPI: 86 yo WF with vaginal atrophy, also lichen sclerosis and a history of hematuria who presents today for stinging and burning in the vaginal area and urethra with her daughter [name].   History of hematuria CTU in 11/2016 was negative.  Cystoscopy in 12/2016 noted mild radiation changes.  She does not report any gross hematuria.  She has been having micro heme with her PCP.  Vaginal atrophy Vulva biopsy by gynecology was negative.   She has been using the vaginal estrogen cream on Monday, Wednesday and Friday.  Clobetasol twice daily.  She states the vaginal burning has almost resolved.  She continues to have baseline urgency, nocturia and incontinence.  This is not bothersome to her at this time.    Patient denies any modifying or aggravating factors.  Patient denies any gross hematuria, dysuria or suprapubic/flank pain.  Patient denies any fevers, chills, nausea or vomiting.   PMH: Past Medical History:  Diagnosis Date  . Anxiety   . Arthritis   . Back pain    chronic  . Cancer [facility])    Vaginal - radiation treatments/mohs for melanoma left eyelid  . COPD (chronic obstructive pulmonary disease) ([place])   . Dizziness    in am  . Heart murmur   . Hypertension   . Hyperthyroidism   . Peripheral vascular disease [facility])     Surgical History: Past Surgical History:  Procedure Laterality Date  . ABDOMINAL HYSTERECTOMY    . ENDARTERECTOMY Right 10/17/2015   Procedure: ENDARTERECTOMY CAROTID;  Surgeon: Algernon Huxley, MD;  Location: [facility] ORS;  Service: Vascular;  Laterality: Right;  . MOHS SURGERY    . vaginectomy     partial    Home Medications:  Allergies as of 08/30/2019      Reactions   Daypro [oxaprozin] Other (See Comments)   gi   Morphine And  Related Nausea And Vomiting   Plaquenil [hydroxychloroquine Sulfate]    Pt denies   Ridaura [auranofin] Other (See Comments)   denies      Medication List       Accurate as of August 30, 2019 11:59 PM. If you have any questions, ask your nurse or doctor.        ALPRAZolam 0.25 MG tablet Commonly known as: XANAX Take 0.25 mg by mouth 3 (three) times daily as needed for anxiety.   amLODipine 2.5 MG tablet Commonly known as: NORVASC Take 2.5 mg by mouth daily. Takes 2 to = 5 mg   aspirin EC 81 MG tablet Take 81 mg every other day by mouth.   Clobetasol Prop Emollient Base 0.05 % emollient cream Commonly known as: Clobetasol Propionate E Apply 1 application topically 2 (two) times daily.   COD LIVER OIL PO Take by mouth. Every other day   conjugated estrogens vaginal cream Commonly known as: Premarin Place 1 Applicatorful vaginally daily. Apply 0.5mg  (pea-sized amount)  just inside the vaginal introitus with a finger-tip every night for two weeks and then Monday, Wednesday and Friday nights.   Premarin vaginal cream Generic drug: conjugated estrogens Place 1 Applicatorful vaginally daily. Apply 0.5mg  (pea-sized amount)  just inside the vaginal introitus with a finger-tip on  Monday, Wednesday and Friday nights.   Lidocaine 2 % Gel Apply 1  Act topically as needed.   lisinopril 20 MG tablet Commonly known as: ZESTRIL Take 20 mg by mouth daily.   methimazole 5 MG tablet Commonly known as: TAPAZOLE Take 5 mg by mouth daily.   metoprolol tartrate 100 MG tablet Commonly known as: LOPRESSOR Take 100 mg by mouth.   mirtazapine 15 MG tablet Commonly known as: REMERON Take by mouth.       Allergies:  Allergies  Allergen Reactions  . Daypro [Oxaprozin] Other (See Comments)    gi  . Morphine And Related Nausea And Vomiting  . Plaquenil [Hydroxychloroquine Sulfate]     Pt denies  . Ridaura [Auranofin] Other (See Comments)    denies    Family History: Family  History  Problem Relation Age of Onset  . Kidney disease Sister   . Kidney Stones Sister   . Kidney cancer Neg Hx   . Prostate cancer Neg Hx     Social History:  reports that she quit smoking about 33 years ago. She has never used smokeless tobacco. She reports that she does not drink alcohol and does not use drugs.  ROS: For pertinent review of systems please refer to history of present illness  Physical Exam: BP (!) 166/69   Pulse 62   Temp 98.3 F (36.8 C)   SpO2 98%   Constitutional:  Well nourished. Alert and oriented, No acute distress. HEENT: [place] AT, mask in place.  Trachea midline Cardiovascular: No clubbing, cyanosis, or edema. Respiratory: Normal respiratory effort, no increased work of breathing. Neurologic: Grossly intact, no focal deficits, moving all 4 extremities. Psychiatric: Normal mood and affect.   Assessment & Plan:    1. History of hematuria Hematuria work up completed in 12/2016 - NED No report of gross hematuria  Persistent micro heme at PCP's office Has CT next week with GI for weight loss, anorexia, abdominal pain and nausea Daughter would like to discuss whether or not they would like to pursue a cystoscopy at this time with her sister and with the patient  2. Vaginal atrophy Continue the vaginal cream tid weekly   3. Lichen sclerosis Continue clobetasol BID daily  4. Vulvar pruritis Using lidocaine prn  These notes generated with voice recognition software. I apologize for typographical errors.  [place], [place] [address] [phone]

## 2019-08-30 ENCOUNTER — Encounter: Payer: Self-pay | Admitting: Urology

## 2019-08-30 ENCOUNTER — Ambulatory Visit: Payer: Medicare Other | Admitting: Urology

## 2019-08-30 ENCOUNTER — Other Ambulatory Visit: Payer: Self-pay

## 2019-08-30 VITALS — BP 166/69 | HR 62 | Temp 98.3°F

## 2019-08-30 DIAGNOSIS — L900 Lichen sclerosus et atrophicus: Secondary | ICD-10-CM

## 2019-08-30 DIAGNOSIS — N952 Postmenopausal atrophic vaginitis: Secondary | ICD-10-CM

## 2019-08-30 DIAGNOSIS — L292 Pruritus vulvae: Secondary | ICD-10-CM

## 2019-08-30 MED ORDER — LIDOCAINE 2 % EX GEL: 1.0000 | CUTANEOUS | 3 refills | Status: DC | PRN

## 2019-08-30 NOTE — Patient Instructions (Signed)
Follow up pending CT results.

## 2019-09-06 ENCOUNTER — Other Ambulatory Visit: Payer: Self-pay

## 2019-09-06 ENCOUNTER — Ambulatory Visit
Admission: RE | Admit: 2019-09-06 | Discharge: 2019-09-06 | Disposition: A | Discharge status: Home / Self Care | Payer: Medicare Other | Source: Ambulatory Visit | Attending: Student | Admitting: Student

## 2019-09-06 DIAGNOSIS — Z87891 Personal history of nicotine dependence: Secondary | ICD-10-CM | POA: Insufficient documentation

## 2019-09-06 DIAGNOSIS — R634 Abnormal weight loss: Secondary | ICD-10-CM | POA: Insufficient documentation

## 2019-09-06 DIAGNOSIS — R630 Anorexia: Secondary | ICD-10-CM | POA: Diagnosis present

## 2019-09-06 DIAGNOSIS — K5900 Constipation, unspecified: Secondary | ICD-10-CM | POA: Diagnosis present

## 2019-09-06 MED ORDER — IOHEXOL 300 MG/ML  SOLN
75.0000 mL | Freq: Once | INTRAMUSCULAR | Status: AC | PRN
  Administered 2019-09-06: 75 mL via INTRAVENOUS

## 2019-09-12 ENCOUNTER — Telehealth: Payer: Self-pay | Admitting: Urology

## 2019-09-12 NOTE — Telephone Encounter (Signed)
Patient daughter want to hold off on the cysto. Will call us back if she needs [place].

## 2019-09-12 NOTE — Telephone Encounter (Signed)
Please ask either of her daughters if they have discussed whether or not they wish to proceed with the cystoscopy for their mother.

## 2019-10-12 ENCOUNTER — Emergency Department: Payer: Medicare Other

## 2019-10-12 ENCOUNTER — Other Ambulatory Visit: Payer: Self-pay

## 2019-10-12 ENCOUNTER — Inpatient Hospital Stay
Admission: EM | Admit: 2019-10-12 | Discharge: 2019-10-17 | DRG: 872 | Disposition: A | Discharge status: Skilled Nursing Facility | Payer: Medicare Other | Attending: Internal Medicine | Admitting: Internal Medicine

## 2019-10-12 DIAGNOSIS — Z923 Personal history of irradiation: Secondary | ICD-10-CM

## 2019-10-12 DIAGNOSIS — D649 Anemia, unspecified: Secondary | ICD-10-CM | POA: Diagnosis present

## 2019-10-12 DIAGNOSIS — I248 Other forms of acute ischemic heart disease: Secondary | ICD-10-CM | POA: Diagnosis present

## 2019-10-12 DIAGNOSIS — J449 Chronic obstructive pulmonary disease, unspecified: Secondary | ICD-10-CM | POA: Diagnosis present

## 2019-10-12 DIAGNOSIS — I6529 Occlusion and stenosis of unspecified carotid artery: Secondary | ICD-10-CM | POA: Diagnosis present

## 2019-10-12 DIAGNOSIS — M1990 Unspecified osteoarthritis, unspecified site: Secondary | ICD-10-CM | POA: Diagnosis present

## 2019-10-12 DIAGNOSIS — I739 Peripheral vascular disease, unspecified: Secondary | ICD-10-CM | POA: Diagnosis present

## 2019-10-12 DIAGNOSIS — D72829 Elevated white blood cell count, unspecified: Secondary | ICD-10-CM | POA: Diagnosis present

## 2019-10-12 DIAGNOSIS — Z20822 Contact with and (suspected) exposure to covid-19: Secondary | ICD-10-CM | POA: Diagnosis present

## 2019-10-12 DIAGNOSIS — Z87891 Personal history of nicotine dependence: Secondary | ICD-10-CM | POA: Diagnosis not present

## 2019-10-12 DIAGNOSIS — N10 Acute tubulo-interstitial nephritis: Secondary | ICD-10-CM

## 2019-10-12 DIAGNOSIS — R29810 Facial weakness: Secondary | ICD-10-CM | POA: Diagnosis present

## 2019-10-12 DIAGNOSIS — I4891 Unspecified atrial fibrillation: Secondary | ICD-10-CM

## 2019-10-12 DIAGNOSIS — Z8541 Personal history of malignant neoplasm of cervix uteri: Secondary | ICD-10-CM | POA: Diagnosis not present

## 2019-10-12 DIAGNOSIS — R7989 Other specified abnormal findings of blood chemistry: Secondary | ICD-10-CM

## 2019-10-12 DIAGNOSIS — Z85820 Personal history of malignant melanoma of skin: Secondary | ICD-10-CM

## 2019-10-12 DIAGNOSIS — I10 Essential (primary) hypertension: Secondary | ICD-10-CM | POA: Diagnosis present

## 2019-10-12 DIAGNOSIS — N179 Acute kidney failure, unspecified: Secondary | ICD-10-CM | POA: Diagnosis present

## 2019-10-12 DIAGNOSIS — R4182 Altered mental status, unspecified: Secondary | ICD-10-CM | POA: Diagnosis not present

## 2019-10-12 DIAGNOSIS — R059 Cough, unspecified: Secondary | ICD-10-CM

## 2019-10-12 DIAGNOSIS — F419 Anxiety disorder, unspecified: Secondary | ICD-10-CM | POA: Diagnosis present

## 2019-10-12 DIAGNOSIS — Z79899 Other long term (current) drug therapy: Secondary | ICD-10-CM

## 2019-10-12 DIAGNOSIS — Z885 Allergy status to narcotic agent status: Secondary | ICD-10-CM

## 2019-10-12 DIAGNOSIS — Z888 Allergy status to other drugs, medicaments and biological substances status: Secondary | ICD-10-CM

## 2019-10-12 DIAGNOSIS — R54 Age-related physical debility: Secondary | ICD-10-CM | POA: Diagnosis present

## 2019-10-12 DIAGNOSIS — R4701 Aphasia: Secondary | ICD-10-CM | POA: Diagnosis present

## 2019-10-12 DIAGNOSIS — N12 Tubulo-interstitial nephritis, not specified as acute or chronic: Secondary | ICD-10-CM

## 2019-10-12 DIAGNOSIS — Z841 Family history of disorders of kidney and ureter: Secondary | ICD-10-CM

## 2019-10-12 DIAGNOSIS — Z90710 Acquired absence of both cervix and uterus: Secondary | ICD-10-CM | POA: Diagnosis not present

## 2019-10-12 DIAGNOSIS — A419 Sepsis, unspecified organism: Secondary | ICD-10-CM | POA: Diagnosis present

## 2019-10-12 DIAGNOSIS — R778 Other specified abnormalities of plasma proteins: Secondary | ICD-10-CM | POA: Diagnosis not present

## 2019-10-12 DIAGNOSIS — Z7189 Other specified counseling: Secondary | ICD-10-CM | POA: Diagnosis not present

## 2019-10-12 DIAGNOSIS — E0590 Thyrotoxicosis, unspecified without thyrotoxic crisis or storm: Secondary | ICD-10-CM | POA: Diagnosis present

## 2019-10-12 DIAGNOSIS — I361 Nonrheumatic tricuspid (valve) insufficiency: Secondary | ICD-10-CM | POA: Diagnosis not present

## 2019-10-12 DIAGNOSIS — I351 Nonrheumatic aortic (valve) insufficiency: Secondary | ICD-10-CM | POA: Diagnosis not present

## 2019-10-12 DIAGNOSIS — Z515 Encounter for palliative care: Secondary | ICD-10-CM | POA: Diagnosis not present

## 2019-10-12 DIAGNOSIS — Z66 Do not resuscitate: Secondary | ICD-10-CM | POA: Diagnosis not present

## 2019-10-12 DIAGNOSIS — I340 Nonrheumatic mitral (valve) insufficiency: Secondary | ICD-10-CM | POA: Diagnosis not present

## 2019-10-12 DIAGNOSIS — H9190 Unspecified hearing loss, unspecified ear: Secondary | ICD-10-CM | POA: Diagnosis present

## 2019-10-12 DIAGNOSIS — Z7982 Long term (current) use of aspirin: Secondary | ICD-10-CM

## 2019-10-12 DIAGNOSIS — R630 Anorexia: Secondary | ICD-10-CM | POA: Diagnosis present

## 2019-10-12 LAB — COMPREHENSIVE METABOLIC PANEL
ALT: 20 U/L (ref 0–44)
AST: 46 U/L — ABNORMAL HIGH (ref 15–41)
Albumin: 2.9 g/dL — ABNORMAL LOW (ref 3.5–5.0)
Alkaline Phosphatase: 71 U/L (ref 38–126)
Anion gap: 11 (ref 5–15)
BUN: 38 mg/dL — ABNORMAL HIGH (ref 8–23)
CO2: 25 mmol/L (ref 22–32)
Calcium: 8 mg/dL — ABNORMAL LOW (ref 8.9–10.3)
Chloride: 94 mmol/L — ABNORMAL LOW (ref 98–111)
Creatinine, Ser: 1.23 mg/dL — ABNORMAL HIGH (ref 0.44–1.00)
GFR calc Af Amer: 44 mL/min — ABNORMAL LOW (ref >60)
GFR calc non Af Amer: 38 mL/min — ABNORMAL LOW (ref >60)
Glucose, Bld: 135 mg/dL — ABNORMAL HIGH (ref 70–99)
Potassium: 3.8 mmol/L (ref 3.5–5.1)
Sodium: 130 mmol/L — ABNORMAL LOW (ref 135–145)
Total Bilirubin: 1.3 mg/dL — ABNORMAL HIGH (ref 0.3–1.2)
Total Protein: 6.8 g/dL (ref 6.5–8.1)

## 2019-10-12 LAB — CBC WITH DIFFERENTIAL/PLATELET
Abs Immature Granulocytes: 0.06 10*3/uL (ref 0.00–0.07)
Basophils Absolute: 0 10*3/uL (ref 0.0–0.1)
Basophils Relative: 0 %
Eosinophils Absolute: 0 10*3/uL (ref 0.0–0.5)
Eosinophils Relative: 0 %
HCT: 32.3 % — ABNORMAL LOW (ref 36.0–46.0)
Hemoglobin: 10.3 g/dL — ABNORMAL LOW (ref 12.0–15.0)
Immature Granulocytes: 1 %
Lymphocytes Relative: 4 %
Lymphs Abs: 0.5 10*3/uL — ABNORMAL LOW (ref 0.7–4.0)
MCH: 28.7 pg (ref 26.0–34.0)
MCHC: 31.9 g/dL (ref 30.0–36.0)
MCV: 90 fL (ref 80.0–100.0)
Monocytes Absolute: 1.4 10*3/uL — ABNORMAL HIGH (ref 0.1–1.0)
Monocytes Relative: 11 %
Neutro Abs: 10.3 10*3/uL — ABNORMAL HIGH (ref 1.7–7.7)
Neutrophils Relative %: 84 %
Platelets: 91 10*3/uL — ABNORMAL LOW (ref 150–400)
RBC: 3.59 MIL/uL — ABNORMAL LOW (ref 3.87–5.11)
RDW: 14.5 % (ref 11.5–15.5)
WBC: 12.2 10*3/uL — ABNORMAL HIGH (ref 4.0–10.5)
nRBC: 0 % (ref 0.0–0.2)

## 2019-10-12 LAB — URINALYSIS, COMPLETE (UACMP) WITH MICROSCOPIC
Bilirubin Urine: NEGATIVE
Glucose, UA: NEGATIVE mg/dL
Ketones, ur: NEGATIVE mg/dL
Nitrite: NEGATIVE
Protein, ur: 100 mg/dL — AB
RBC / HPF: >50 RBC/hpf — ABNORMAL HIGH (ref 0–5)
Specific Gravity, Urine: 1.015 (ref 1.005–1.030)
Squamous Epithelial / LPF: NONE SEEN (ref 0–5)
WBC, UA: >50 WBC/hpf — ABNORMAL HIGH (ref 0–5)
pH: 6 (ref 5.0–8.0)

## 2019-10-12 LAB — RESPIRATORY PANEL BY RT PCR (FLU A&B, COVID)
Influenza A by PCR: NEGATIVE
Influenza B by PCR: NEGATIVE
SARS Coronavirus 2 by RT PCR: NEGATIVE

## 2019-10-12 LAB — BRAIN NATRIURETIC PEPTIDE: B Natriuretic Peptide: 656.2 pg/mL — ABNORMAL HIGH (ref 0.0–100.0)

## 2019-10-12 LAB — LACTIC ACID, PLASMA
Lactic Acid, Venous: 1.7 mmol/L (ref 0.5–1.9)
Lactic Acid, Venous: 1.3 mmol/L (ref 0.5–1.9)

## 2019-10-12 LAB — TROPONIN I (HIGH SENSITIVITY)
Troponin I (High Sensitivity): 36 ng/L — ABNORMAL HIGH (ref <18)
Troponin I (High Sensitivity): 30 ng/L — ABNORMAL HIGH (ref <18)

## 2019-10-12 LAB — LIPASE, BLOOD: Lipase: 30 U/L (ref 11–51)

## 2019-10-12 MED ORDER — SODIUM CHLORIDE 0.9 % IV SOLN: INTRAVENOUS | Status: AC

## 2019-10-12 MED ORDER — SODIUM CHLORIDE 0.9 % IV SOLN: 1.0000 g | Freq: Once | INTRAVENOUS | Status: DC

## 2019-10-12 MED ORDER — MIRTAZAPINE 15 MG PO TABS
15.0000 mg | ORAL_TABLET | Freq: Every day | ORAL | Status: DC
  Administered 2019-10-13 – 2019-10-16 (×4): 15 mg via ORAL
  Filled 2019-10-12 (×4): qty 1

## 2019-10-12 MED ORDER — ENOXAPARIN SODIUM 30 MG/0.3ML ~~LOC~~ SOLN
30.0000 mg | SUBCUTANEOUS | Status: DC
  Administered 2019-10-12 – 2019-10-15 (×4): 30 mg via SUBCUTANEOUS
  Filled 2019-10-12 (×6): qty 0.3

## 2019-10-12 MED ORDER — PANTOPRAZOLE SODIUM 40 MG PO TBEC
40.0000 mg | DELAYED_RELEASE_TABLET | Freq: Every day | ORAL | Status: DC
  Administered 2019-10-13 – 2019-10-17 (×5): 40 mg via ORAL
  Filled 2019-10-12 (×5): qty 1

## 2019-10-12 MED ORDER — ESTROGENS, CONJUGATED 0.625 MG/GM VA CREA
1.0000 | TOPICAL_CREAM | Freq: Every day | VAGINAL | Status: DC
  Administered 2019-10-13: 1 via VAGINAL
  Filled 2019-10-12 (×2): qty 30

## 2019-10-12 MED ORDER — SODIUM CHLORIDE 0.9 % IV BOLUS
500.0000 mL | Freq: Once | INTRAVENOUS | Status: AC
  Administered 2019-10-12: 17:00:00 500 mL via INTRAVENOUS

## 2019-10-12 MED ORDER — METOPROLOL TARTRATE 50 MG PO TABS: 100.0000 mg | ORAL_TABLET | Freq: Two times a day (BID) | ORAL | Status: DC

## 2019-10-12 MED ORDER — IOHEXOL 300 MG/ML  SOLN
75.0000 mL | Freq: Once | INTRAMUSCULAR | Status: AC | PRN
  Administered 2019-10-12: 75 mL via INTRAVENOUS

## 2019-10-12 MED ORDER — METHIMAZOLE 5 MG PO TABS
5.0000 mg | ORAL_TABLET | Freq: Every day | ORAL | Status: DC
  Administered 2019-10-13 – 2019-10-17 (×5): 5 mg via ORAL
  Filled 2019-10-12 (×6): qty 1

## 2019-10-12 MED ORDER — ASPIRIN EC 81 MG PO TBEC
81.0000 mg | DELAYED_RELEASE_TABLET | ORAL | Status: DC
  Administered 2019-10-14 – 2019-10-16 (×2): 81 mg via ORAL
  Filled 2019-10-12 (×3): qty 1

## 2019-10-12 MED ORDER — SODIUM CHLORIDE 0.9 % IV SOLN
2.0000 g | INTRAVENOUS | Status: DC
  Administered 2019-10-12 – 2019-10-16 (×5): 2 g via INTRAVENOUS
  Filled 2019-10-12: qty 0.67
  Filled 2019-10-12 (×3): qty 20
  Filled 2019-10-12: qty 2
  Filled 2019-10-12: qty 20

## 2019-10-12 NOTE — ED Provider Notes (Signed)
[facility] Emergency Department Provider Note ____________________________________________   First MD Initiated Contact with Patient 10/12/19 1603     (approximate)  I have reviewed the triage vital signs and the nursing notes.   HISTORY  Chief Complaint Weakness (EMS initially called for weakness, upon arrival and assessment pt found to be in new onset afib with RVR rates 100-160. Per family, patient also has had intermittent facial droop and aphasia that quickly resolves over the past week. ) and Abdominal Pain (Lower abdominal pain, family stating foul smelling urine)    HPI Kristin Rowe is a 86 y.o. female with PMH as noted below who presents with generalized weakness over the last 4 days, gradual onset, persistent course, associated with nausea but no vomiting.  The patient has also had some diarrhea and lower abdominal discomfort.  She reports lightheadedness and states that she has been stumbling around, but denies any chest pain, shortness of breath, or palpitations.  EMS noted that the patient was found to be in atrial fibrillation, which she does not have a history of.  Past Medical History:  Diagnosis Date  . Anxiety   . Arthritis   . Back pain    chronic  . Cancer [facility])    Vaginal - radiation treatments/mohs for melanoma left eyelid  . COPD (chronic obstructive pulmonary disease) ([place])   . Dizziness    in am  . Heart murmur   . Hypertension   . Hyperthyroidism   . Peripheral vascular disease [facility])     Patient Active Problem List   Diagnosis Date Noted  . Cervical cancer ([place]) 01/09/2018  . Thyroid disease 01/09/2018  . Venous stasis 01/09/2018  . Melanoma in situ of eyelid, left ([place]) 03/02/2017  . Essential hypertension 11/01/2015  . Carotid stenosis, asymptomatic 10/17/2015  . Atypical chest pain 09/30/2015  . PVD (peripheral vascular disease) ([place]) 09/30/2015  . Dizziness 09/25/2015  . Osteoarthritis 10/20/2013  .  Osteoporosis 10/20/2013  . Melanoma in situ ([place]) 04/20/2007    Past Surgical History:  Procedure Laterality Date  . ABDOMINAL HYSTERECTOMY    . ENDARTERECTOMY Right 10/17/2015   Procedure: ENDARTERECTOMY CAROTID;  Surgeon: Algernon Huxley, MD;  Location: [facility] ORS;  Service: Vascular;  Laterality: Right;  . MOHS SURGERY    . vaginectomy     partial    Prior to Admission medications   Medication Sig Start Date End Date Taking? Authorizing Provider  ALPRAZolam (XANAX) 0.25 MG tablet Take 0.25 mg by mouth 3 (three) times daily as needed for anxiety.    [provider]  amLODipine (NORVASC) 2.5 MG tablet Take 2.5 mg by mouth daily. Takes 2 to = 5 mg    [provider]  aspirin EC 81 MG tablet Take 81 mg every other day by mouth.     [provider]  Clobetasol Prop Emollient Base (CLOBETASOL PROPIONATE E) 0.05 % emollient cream Apply 1 application topically 2 (two) times daily. 08/08/19   Zara Council A, PA-C  COD LIVER OIL PO Take by mouth. Every other day    [provider]  conjugated estrogens (PREMARIN) vaginal cream Place 1 Applicatorful vaginally daily. Apply 0.5mg  (pea-sized amount)  just inside the vaginal introitus with a finger-tip every night for two weeks and then Monday, Wednesday and Friday nights. 11/17/16   Zara Council A, PA-C  conjugated estrogens (PREMARIN) vaginal cream Place 1 Applicatorful vaginally daily. Apply 0.5mg  (pea-sized amount)  just inside the vaginal introitus with a finger-tip  on  Monday, Wednesday and Friday nights. 08/08/19   Zara Council A, PA-C  Lidocaine 2 % GEL Apply 1 Act topically as needed. 08/30/19   Zara Council A, PA-C  lisinopril (PRINIVIL,ZESTRIL) 20 MG tablet Take 20 mg by mouth daily.    [provider]  methimazole (TAPAZOLE) 5 MG tablet Take 5 mg by mouth daily.    [provider]  metoprolol (LOPRESSOR) 100 MG tablet Take 100 mg by mouth.    [provider]  mirtazapine  (REMERON) 15 MG tablet Take by mouth. 01/25/18 01/25/19  [provider]    Allergies Daypro [oxaprozin], Morphine and related, Plaquenil [hydroxychloroquine sulfate], and Ridaura [auranofin]  Family History  Problem Relation Age of Onset  . Kidney disease Sister   . Kidney Stones Sister   . Kidney cancer Neg Hx   . Prostate cancer Neg Hx     Social History Social History   Tobacco Use  . Smoking status: Former Smoker    Quit date: 10/09/1985    Years since quitting: 34.0  . Smokeless tobacco: Never Used  Vaping Use  . Vaping Use: Never used  Substance Use Topics  . Alcohol use: No  . Drug use: No    Review of Systems  Constitutional: No fever/chills.  Positive for weakness. Eyes: No visual changes. ENT: No sore throat. Cardiovascular: Denies chest pain. Respiratory: Denies shortness of breath. Gastrointestinal: No vomiting.  Positive for diarrhea.  Genitourinary: Negative for dysuria.  Musculoskeletal: Negative for back pain. Skin: Negative for rash. Neurological: Negative for headache.   ____________________________________________   PHYSICAL EXAM:  VITAL SIGNS: ED Triage Vitals  Enc Vitals Group     BP 10/12/19 1554 116/63     Pulse Rate 10/12/19 1554 (!) 110     Resp 10/12/19 1554 20     Temp 10/12/19 1554 98.8 F (37.1 C)     Temp Source 10/12/19 1554 Oral     SpO2 10/12/19 1554 95 %     Weight 10/12/19 1556 120 lb (54.4 kg)     Height 10/12/19 1556 5\' 5"  (1.651 m)     Head Circumference --      Peak Flow --      Pain Score 10/12/19 1556 6     Pain Loc --      Pain Edu? --      Excl. in [place]? --     Constitutional: Alert and oriented. Well appearing for age and in no acute distress. Eyes: Conjunctivae are normal.  No scleral icterus. Head: Atraumatic. Nose: No congestion/rhinnorhea. Mouth/Throat: Mucous membranes are slightly dry.   Neck: Normal range of motion.  Cardiovascular: Normal rate, regular rhythm. Grossly normal heart sounds.   Good peripheral circulation. Respiratory: Normal respiratory effort.  No retractions. Lungs CTAB. Gastrointestinal: Soft and nontender. No distention.  Genitourinary: No flank tenderness. Musculoskeletal: No lower extremity edema.  Extremities warm and well perfused.  Neurologic:  Normal speech and language. Motor intact in all extremities.  No facial droop.  No ataxia.  Skin:  Skin is warm and dry. No rash noted. Psychiatric: Mood and affect are normal. Speech and behavior are normal.  ____________________________________________   LABS (all labs ordered are listed, but only abnormal results are displayed)  Labs Reviewed  COMPREHENSIVE METABOLIC PANEL - Abnormal; Notable for the following components:      Result Value   Sodium 130 (*)    Chloride 94 (*)    Glucose, Bld 135 (*)    BUN 38 (*)  Creatinine, Ser 1.23 (*)    Calcium 8.0 (*)    Albumin 2.9 (*)    AST 46 (*)    Total Bilirubin 1.3 (*)    GFR calc non Af Amer 38 (*)    GFR calc Af Amer 44 (*)    All other components within normal limits  BRAIN NATRIURETIC PEPTIDE - Abnormal; Notable for the following components:   B Natriuretic Peptide 656.2 (*)    All other components within normal limits  CBC WITH DIFFERENTIAL/PLATELET - Abnormal; Notable for the following components:   WBC 12.2 (*)    RBC 3.59 (*)    Hemoglobin 10.3 (*)    HCT 32.3 (*)    Platelets 91 (*)    Neutro Abs 10.3 (*)    Lymphs Abs 0.5 (*)    Monocytes Absolute 1.4 (*)    All other components within normal limits  URINALYSIS, COMPLETE (UACMP) WITH MICROSCOPIC - Abnormal; Notable for the following components:   Color, Urine YELLOW (*)    APPearance CLOUDY (*)    Hgb urine dipstick LARGE (*)    Protein, ur 100 (*)    Leukocytes,Ua MODERATE (*)    RBC / HPF >50 (*)    WBC, UA >50 (*)    Bacteria, UA MANY (*)    All other components within normal limits  TROPONIN I (HIGH SENSITIVITY) - Abnormal; Notable for the following components:   Troponin  I (High Sensitivity) 36 (*)    All other components within normal limits  RESPIRATORY PANEL BY RT PCR (FLU A&B, COVID)  LACTIC ACID, PLASMA  LIPASE, BLOOD  LACTIC ACID, PLASMA  TROPONIN I (HIGH SENSITIVITY)   ____________________________________________  EKG  ED ECG REPORT I, Arta Silence, the attending physician, personally viewed and interpreted this ECG.  Date: 10/12/2019 EKG Time: 15:52 Rate: 115 Rhythm: Atrial fibrillation QRS Axis: normal Intervals: RBBB, LAFB ST/T Wave abnormalities: normal Narrative Interpretation: atrial fibrillation with no evidence of acute ischemia ____________________________________________  RADIOLOGY  Chest x-ray interpreted by me shows no focal infiltrate or significant edema CT head interpreted by me shows no evidence of acute hemorrhage, midline shift, or mass-effect  ____________________________________________   PROCEDURES  Procedure(s) performed: No  Procedures  Critical Care performed: No ____________________________________________   INITIAL IMPRESSION / ASSESSMENT AND PLAN / ED COURSE  Pertinent labs & imaging results that were available during my care of the patient were reviewed by me and considered in my medical decision making (see chart for details).  86 year old female with PMH as noted above including history of COPD, hypertension, and peripheral vascular disease presents with generalized weakness over the last several days associated with some diarrhea and lower abdominal discomfort.  Family reports that she has had a few episodes of aphasia and facial droop that resolved after a few minutes.  EMS found her to be in atrial fibrillation with RVR.  I reviewed the past medical records in Epic; the patient was most recently admitted in 2017 for a carotid endarterectomy.  On exam currently, she is well-appearing for her age.  Her vital signs are normal except for tachycardia to around 110-115.  Lungs are clear.  The  abdomen is soft with mild bilateral lower quadrant discomfort.  There is no peripheral edema.  Differential includes urinary tract infection, gastroenteritis, colitis, diverticulitis, other infection, versus symptoms related to new onset atrial fibrillation.  At this time, the patient has no neurologic deficits and there is no evidence of stroke, although she may have had a  TIA.  We will obtain lab work-up, chest x-ray, CT head and abdomen, and reassess.  ----------------------------------------- 7:08 PM on 10/12/2019 -----------------------------------------  Urinalysis is consistent with a UTI and CT shows evidence of right-sided pyelonephritis.  I suspect that this is the primary cause of the patient's symptoms.  CT head shows no acute abnormalities.  The patient remains in atrial fibrillation at a rate between 90 and 110.  Troponin and BNP are slightly elevated although there is no evidence of NSTEMI.  Given the above findings, the patient will require admission for further management.  I have ordered a dose of ceftriaxone.  I discussed the case with Dr. Flossie Buffy from the hospitalist service.  ____________________________________________   FINAL CLINICAL IMPRESSION(S) / ED DIAGNOSES  Final diagnoses:  Pyelonephritis  Atrial fibrillation, unspecified type ([place])      NEW MEDICATIONS STARTED DURING THIS VISIT:  New Prescriptions   No medications on file     Note:  This document was prepared using Dragon voice recognition software and may include unintentional dictation errors.    Arta Silence, MD 10/12/19 [phone]

## 2019-10-12 NOTE — H&P (Signed)
History and Physical    Kristin Rowe VQM:[id number] DOB: 25-Jan-1926 DOA: 10/12/2019  PCP: [name], MD  Patient coming from: Home, daughter at bedside  I have personally briefly reviewed patient's old medical records in [place]  Chief Complaint: Weakness, decreased p.o. intake  HPI: [name] is a 86 y.o. female with medical history significant for PVD, carotid stenosis s/p right endarterectomy, history of cervical cancer s/p radiation in 2011 and hypertension who presents with concerns of weakness and decreased p.o. intake.  Daughter at bedside provides most of the history as patient has hearing difficulties.  Patient lives alone but daughter lives next door to her.  For the past 4 days has not been wanting to eat or drink.  Has been complaining of nausea but denies any vomiting.  She is also been complaining of lower abdominal pain and today had several episodes of diarrhea.  Daughter has been checking her vital signs at home and noted that in the past couple days her heart rate has dropped down to the 40s but today it was up to 120s and she was very tremulous and weak so daughter decided to bring her in.  Patient denies any chest pain or shortness of breath.  Denies any fever.  On EMS arrival, patient was found to be in atrial fibrillation with RVR with heart rates around 140-150s.  In the ER this has been more rate controlled to around 100.  Blood pressure has been borderline soft around 109 over 50s. Troponin of 36 to 30.  BNP of 656. CBC shows leukocytosis of 12.2, hemoglobin with normocytic anemia of 10.3 from a prior of 11.7 a month ago.  Platelets are also low at 91 from a prior of 114 last month. Creatinine elevated at 1.23 from a prior of 0.8 in August.  UA shows moderate leukocyte, negative nitrite and many bacteria. CT abdomen and pelvis was notable for right-sided pyelonephritis.  Review of Systems: Unable to fully obtain since patient has hearing  difficulty and sometimes was confused with answering questions.  Past Medical History:  Diagnosis Date  . Anxiety   . Arthritis   . Back pain    chronic  . Cancer [facility])    Vaginal - radiation treatments/mohs for melanoma left eyelid  . COPD (chronic obstructive pulmonary disease) ([place])   . Dizziness    in am  . Heart murmur   . Hypertension   . Hyperthyroidism   . Peripheral vascular disease [facility])     Past Surgical History:  Procedure Laterality Date  . ABDOMINAL HYSTERECTOMY    . ENDARTERECTOMY Right 10/17/2015   Procedure: ENDARTERECTOMY CAROTID;  Surgeon: Algernon Huxley, MD;  Location: [facility] ORS;  Service: Vascular;  Laterality: Right;  . MOHS SURGERY    . vaginectomy     partial     reports that she quit smoking about 34 years ago. She has never used smokeless tobacco. She reports that she does not drink alcohol and does not use drugs. Social History  Allergies  Allergen Reactions  . Daypro [Oxaprozin] Other (See Comments)    gi  . Morphine And Related Nausea And Vomiting  . Plaquenil [Hydroxychloroquine Sulfate]     Pt denies  . Ridaura [Auranofin] Other (See Comments)    denies    Family History  Problem Relation Age of Onset  . Kidney disease Sister   . Kidney Stones Sister   . Kidney cancer Neg Hx   . Prostate cancer Neg Hx  Prior to Admission medications   Medication Sig Start Date End Date Taking? Authorizing Provider  amLODipine (NORVASC) 2.5 MG tablet Take 2.5 mg by mouth 2 (two) times daily. Takes 2 to = 5 mg   Yes [provider]  lisinopril (PRINIVIL,ZESTRIL) 20 MG tablet Take 20 mg by mouth daily.   Yes [provider]  metoprolol (LOPRESSOR) 100 MG tablet Take 100 mg by mouth 2 (two) times daily.    Yes [provider]  ALPRAZolam (XANAX) 0.25 MG tablet Take 0.25 mg by mouth 3 (three) times daily as needed for anxiety.    [provider]  aspirin EC 81 MG tablet Take 81 mg every other day by mouth.      [provider]  Clobetasol Prop Emollient Base (CLOBETASOL PROPIONATE E) 0.05 % emollient cream Apply 1 application topically 2 (two) times daily. 08/08/19   Zara Council A, PA-C  COD LIVER OIL PO Take by mouth. Every other day    [provider]  conjugated estrogens (PREMARIN) vaginal cream Place 1 Applicatorful vaginally daily. Apply 0.5mg  (pea-sized amount)  just inside the vaginal introitus with a finger-tip on  Monday, Wednesday and Friday nights. 08/08/19   Zara Council A, PA-C  Lidocaine 2 % GEL Apply 1 Act topically as needed. 08/30/19   Zara Council A, PA-C  methimazole (TAPAZOLE) 5 MG tablet Take 5 mg by mouth daily.    [provider]  mirtazapine (REMERON) 15 MG tablet Take 15 mg by mouth at bedtime.  01/25/18 01/25/19  [provider]  pantoprazole (PROTONIX) 40 MG tablet Take 40 mg by mouth daily. 08/25/19   [provider]    Physical Exam: Vitals:   10/12/19 1554 10/12/19 1556 10/12/19 1600 10/12/19 1630  BP: 116/63  116/63 (!) 109/59  Pulse: (!) 110  61 (!) 111  Resp: 20  (!) 23 (!) 30  Temp: 98.8 F (37.1 C)     TempSrc: Oral     SpO2: 95%  97% 93%  Weight:  54.4 kg    Height:  5\' 5"  (1.651 m)      Constitutional: NAD, calm, comfortable, nontoxic appearing elderly female laying flat in bed Vitals:   10/12/19 1554 10/12/19 1556 10/12/19 1600 10/12/19 1630  BP: 116/63  116/63 (!) 109/59  Pulse: (!) 110  61 (!) 111  Resp: 20  (!) 23 (!) 30  Temp: 98.8 F (37.1 C)     TempSrc: Oral     SpO2: 95%  97% 93%  Weight:  54.4 kg    Height:  5\' 5"  (1.651 m)     Eyes: PERRL, lids and conjunctivae normal ENMT: Mucous membranes are moist.  Neck: normal, supple Respiratory: clear to auscultation bilaterally, no wheezing, no crackles. Normal respiratory effort. No accessory muscle use.  Cardiovascular: Irregularly irregular rate and rhythm, no murmurs / rubs / gallops. No extremity edema.  Abdomen: Mild lower quadrant  tenderness with no rebound, rigidity or guarding, no masses palpated.  Bowel sounds positive.  Musculoskeletal: no clubbing / cyanosis. No joint deformity upper and lower extremities. Good ROM, no contractures. Normal muscle tone.  Skin: no rashes, lesions, ulcers. No induration Neurologic: CN 2-12 grossly intact. Sensation intact. Strength 5/5 of the lower extremity and 4 out of 5 of the upper extremities with some intentional tremors Psychiatric: Alert and oriented to self, time and current event but could not tell of the location.    Labs on Admission: I have personally reviewed following labs and  imaging studies  CBC: Recent Labs  Lab 10/12/19 1612  WBC 12.2*  NEUTROABS 10.3*  HGB 10.3*  HCT 32.3*  MCV 90.0  PLT 91*   Basic Metabolic Panel: Recent Labs  Lab 10/12/19 1612  NA 130*  K 3.8  CL 94*  CO2 25  GLUCOSE 135*  BUN 38*  CREATININE 1.23*  CALCIUM 8.0*   GFR: Estimated Creatinine Clearance: 25.6 mL/min (A) (by C-G formula based on SCr of 1.23 mg/dL (H)). Liver Function Tests: Recent Labs  Lab 10/12/19 1612  AST 46*  ALT 20  ALKPHOS 71  BILITOT 1.3*  PROT 6.8  ALBUMIN 2.9*   Recent Labs  Lab 10/12/19 1612  LIPASE 30   No results for input(s): AMMONIA in the last 168 hours. Coagulation Profile: No results for input(s): INR, PROTIME in the last 168 hours. Cardiac Enzymes: No results for input(s): CKTOTAL, CKMB, CKMBINDEX, TROPONINI in the last 168 hours. BNP (last 3 results) No results for input(s): PROBNP in the last 8760 hours. HbA1C: No results for input(s): HGBA1C in the last 72 hours. CBG: No results for input(s): GLUCAP in the last 168 hours. Lipid Profile: No results for input(s): CHOL, HDL, LDLCALC, TRIG, CHOLHDL, LDLDIRECT in the last 72 hours. Thyroid Function Tests: No results for input(s): TSH, T4TOTAL, FREET4, T3FREE, THYROIDAB in the last 72 hours. Anemia Panel: No results for input(s): VITAMINB12, FOLATE, FERRITIN, TIBC, IRON,  RETICCTPCT in the last 72 hours. Urine analysis:    Component Value Date/Time   COLORURINE YELLOW (A) 10/12/2019 1612   APPEARANCEUR CLOUDY (A) 10/12/2019 1612   APPEARANCEUR Clear 12/24/2016 1350   LABSPEC 1.015 10/12/2019 1612   PHURINE 6.0 10/12/2019 1612   GLUCOSEU NEGATIVE 10/12/2019 1612   HGBUR LARGE (A) 10/12/2019 1612   BILIRUBINUR NEGATIVE 10/12/2019 1612   BILIRUBINUR Negative 12/24/2016 1350   KETONESUR NEGATIVE 10/12/2019 1612   PROTEINUR 100 (A) 10/12/2019 1612   NITRITE NEGATIVE 10/12/2019 1612   LEUKOCYTESUR MODERATE (A) 10/12/2019 1612    Radiological Exams on Admission: CT Head Wo Contrast  Result Date: 10/12/2019 CLINICAL DATA:  Confusion, dysuria, TIA symptoms EXAM: CT HEAD WITHOUT CONTRAST TECHNIQUE: Contiguous axial images were obtained from the base of the skull through the vertex without intravenous contrast. COMPARISON:  09/22/2016, 09/24/2015 FINDINGS: Brain: No acute infarct or hemorrhage. Lateral ventricles and midline structures are unremarkable. There are no acute extra-axial fluid collections. There is no mass effect. Vascular: No hyperdense vessel or unexpected calcification. Skull: Normal. Negative for fracture or focal lesion. Sinuses/Orbits: No acute finding. Other: None. IMPRESSION: 1. No acute intracranial process. Electronically Signed   By: Randa Ngo M.D.   On: 10/12/2019 17:50   CT ABDOMEN PELVIS W CONTRAST  Result Date: 10/12/2019 CLINICAL DATA:  Acute abdominal pain, dysuria EXAM: CT ABDOMEN AND PELVIS WITH CONTRAST TECHNIQUE: Multidetector CT imaging of the abdomen and pelvis was performed using the standard protocol following bolus administration of intravenous contrast. CONTRAST:  72mL OMNIPAQUE IOHEXOL 300 MG/ML  SOLN COMPARISON:  09/06/2019 FINDINGS: Lower chest: No acute pleural or parenchymal lung disease. Hepatobiliary: Noncalcified gallstones versus gallbladder sludge identified. No evidence of acute cholecystitis. Liver is  unremarkable. Pancreas: Unremarkable. No pancreatic ductal dilatation or surrounding inflammatory changes. Spleen: Normal in size without focal abnormality. Adrenals/Urinary Tract: Heterogeneous decreased attenuation throughout the right renal parenchyma consistent with diffuse right-sided pyelonephritis. No evidence of renal abscess. Hyperenhancement of the right ureteral mucosa consistent with infection. The left kidney enhances normally. No urinary tract calculi or obstruction. The bladder is mildly  distended with no focal abnormality. Adrenals are normal. Stomach/Bowel: No bowel obstruction or ileus. Diverticulosis of the distal colon without diverticulitis. Normal retrocecal appendix. Vascular/Lymphatic: Aortic atherosclerosis. No enlarged abdominal or pelvic lymph nodes. Reproductive: Status post hysterectomy. No adnexal masses. Other: Trace pelvic free fluid, nonspecific. No free gas. No abdominal wall hernia. Musculoskeletal: No acute or destructive bony lesions. Reconstructed images demonstrate no additional findings. IMPRESSION: 1. Right-sided pyelonephritis.  No evidence of right renal abscess. 2. Noncalcified gallstones versus gallbladder sludge. No evidence of acute cholecystitis. 3. Trace pelvic free fluid, nonspecific. 4. Aortic Atherosclerosis (ICD10-I70.0). Electronically Signed   By: Randa Ngo M.D.   On: 10/12/2019 17:48   DG Chest Portable 1 View  Result Date: 10/12/2019 CLINICAL DATA:  Atrial fibrillation.  Weakness today. EXAM: PORTABLE CHEST 1 VIEW COMPARISON:  No recent exams.  Chest radiograph 11/20/2008 reviewed FINDINGS: Mild cardiomegaly. Normal mediastinal contours. Aortic atherosclerosis. The lungs are hyperinflated with peribronchial thickening. There is biapical pleuroparenchymal scarring. No confluent airspace disease. No significant pleural effusion. No pneumothorax. No acute osseous abnormalities are seen. IMPRESSION: 1. Mild cardiomegaly.  Aortic Atherosclerosis  (ICD10-I70.0). 2. Hyperinflation with peribronchial thickening, imaging findings suggesting COPD. Possibility of superimposed edema is raised. Electronically Signed   By: Keith Rake M.D.   On: 10/12/2019 16:40      Assessment/Plan  Sepsis secondary to pyelonephritis Continue IV Rocephin Has received 500 IV normal saline bolus in the ED Continuous 75 cc normal saline fluid for the next 12 hours  Newly diagnosed atrial fibrillation Patient noted to be in atrial fibrillation with RVR with EMS however this is now rate controlled in the ED.  Suspect could possibly due to infection. Continue home metoprolol Discussed at length the benefits and risk of starting anticoagulation with daughter.  Patient has a CHA2DS2-VASc score 5 and is at a 10% annual risk of stroke/TIA.  However patient lives alone and requires a cane for mobility and daughter says often times she needs to hold onto walls to walk but has not had a history of fall.  She does have progressive worsening anemia and daughter reports that she has had 2 different fecal occult blood results with one showing negative and the other positive.  However due to her age and comorbidities she was deemed a poor candidate for colonoscopy by gastroenterologist but she is at risk of GI bleed with her history of cervical cancer with radiation. Daughter will discussed starting anticoagulation with other family members first.  Should patient convert back to normal sinus rhythm after resolution of her infection, I would recommend that they follow-up with cardiology for cardiac monitoring before committing a 86 year old female with high fall and GI bleed risk to lifelong anticoagulation. obtain echocardiogram  check TSH keep on continuous telemetry   Borderline soft blood pressure with history of hypertension Hold amlodipine and lisinopril  Elevated troponin 36 --> 30 likely demand ischemia from atrial fibrillation  Anemia Check iron panel,  vitamin B12 and folate Patient does get vitamin B12 injections outpatient  PVD with history of carotid endarterectomy Continue aspirin    DVT prophylaxis:.Lovenox Code Status: Full Family Communication: Plan discussed with patient and daughter at bedside  disposition Plan: Home with at least 2 midnight stays  Consults called:  Admission status: inpatient  Status is: Inpatient  Remains inpatient appropriate because:Inpatient level of care appropriate due to severity of illness   Dispo: The patient is from: Home              Anticipated d/c  is to: Home              Anticipated d/c date is: 3 days              Patient currently is not medically stable to d/c.         Orene Desanctis DO [facility]   If 7PM-7AM, please contact night-coverage [email or web address]   10/12/2019, 7:50 PM

## 2019-10-13 ENCOUNTER — Inpatient Hospital Stay (HOSPITAL_COMMUNITY)
Admit: 2019-10-13 | Discharge: 2019-10-13 | Disposition: A | Discharge status: Home / Self Care | Payer: Medicare Other | Attending: Family Medicine | Admitting: Family Medicine

## 2019-10-13 DIAGNOSIS — I340 Nonrheumatic mitral (valve) insufficiency: Secondary | ICD-10-CM

## 2019-10-13 DIAGNOSIS — Z515 Encounter for palliative care: Secondary | ICD-10-CM

## 2019-10-13 DIAGNOSIS — Z7189 Other specified counseling: Secondary | ICD-10-CM

## 2019-10-13 DIAGNOSIS — I361 Nonrheumatic tricuspid (valve) insufficiency: Secondary | ICD-10-CM

## 2019-10-13 DIAGNOSIS — N12 Tubulo-interstitial nephritis, not specified as acute or chronic: Secondary | ICD-10-CM

## 2019-10-13 DIAGNOSIS — Z66 Do not resuscitate: Secondary | ICD-10-CM

## 2019-10-13 DIAGNOSIS — I351 Nonrheumatic aortic (valve) insufficiency: Secondary | ICD-10-CM

## 2019-10-13 LAB — CBC
HCT: 27.9 % — ABNORMAL LOW (ref 36.0–46.0)
Hemoglobin: 8.7 g/dL — ABNORMAL LOW (ref 12.0–15.0)
MCH: 28.2 pg (ref 26.0–34.0)
MCHC: 31.2 g/dL (ref 30.0–36.0)
MCV: 90.3 fL (ref 80.0–100.0)
Platelets: 89 10*3/uL — ABNORMAL LOW (ref 150–400)
RBC: 3.09 MIL/uL — ABNORMAL LOW (ref 3.87–5.11)
RDW: 14.6 % (ref 11.5–15.5)
WBC: 11.4 10*3/uL — ABNORMAL HIGH (ref 4.0–10.5)
nRBC: 0 % (ref 0.0–0.2)

## 2019-10-13 LAB — ECHOCARDIOGRAM COMPLETE
AR max vel: 1.76 cm2
AV Area VTI: 2.51 cm2
AV Area mean vel: 1.9 cm2
AV Mean grad: 3.7 mmHg
AV Peak grad: 7.2 mmHg
Ao pk vel: 1.34 m/s
Area-P 1/2: 5.97 cm2
Height: 65 in
S' Lateral: 2.53 cm
Weight: 1920 oz

## 2019-10-13 LAB — BASIC METABOLIC PANEL
Anion gap: 6 (ref 5–15)
BUN: 30 mg/dL — ABNORMAL HIGH (ref 8–23)
CO2: 28 mmol/L (ref 22–32)
Calcium: 7.6 mg/dL — ABNORMAL LOW (ref 8.9–10.3)
Chloride: 100 mmol/L (ref 98–111)
Creatinine, Ser: 1.05 mg/dL — ABNORMAL HIGH (ref 0.44–1.00)
GFR calc Af Amer: 54 mL/min — ABNORMAL LOW (ref >60)
GFR calc non Af Amer: 46 mL/min — ABNORMAL LOW (ref >60)
Glucose, Bld: 120 mg/dL — ABNORMAL HIGH (ref 70–99)
Potassium: 3.4 mmol/L — ABNORMAL LOW (ref 3.5–5.1)
Sodium: 134 mmol/L — ABNORMAL LOW (ref 135–145)

## 2019-10-13 LAB — VITAMIN B12: Vitamin B-12: 1420 pg/mL — ABNORMAL HIGH (ref 180–914)

## 2019-10-13 LAB — IRON AND TIBC
Iron: 9 ug/dL — ABNORMAL LOW (ref 28–170)
Saturation Ratios: 6 % — ABNORMAL LOW (ref 10.4–31.8)
TIBC: 148 ug/dL — ABNORMAL LOW (ref 250–450)
UIBC: 139 ug/dL

## 2019-10-13 LAB — FOLATE: Folate: 8.7 ng/mL (ref >5.9)

## 2019-10-13 LAB — TSH: TSH: 0.876 u[IU]/mL (ref 0.350–4.500)

## 2019-10-13 MED ORDER — SODIUM CHLORIDE 0.9 % IV SOLN: INTRAVENOUS | Status: DC | PRN

## 2019-10-13 MED ORDER — METOPROLOL TARTRATE 25 MG PO TABS
ORAL_TABLET | ORAL | Status: AC
  Administered 2019-10-13: 25 mg
  Filled 2019-10-13: qty 1

## 2019-10-13 MED ORDER — METOPROLOL TARTRATE 25 MG PO TABS: 25.0000 mg | ORAL_TABLET | ORAL | Status: AC

## 2019-10-13 MED ORDER — METOPROLOL TARTRATE 25 MG PO TABS
25.0000 mg | ORAL_TABLET | Freq: Two times a day (BID) | ORAL | Status: DC
  Administered 2019-10-14 (×2): 25 mg via ORAL
  Filled 2019-10-13 (×2): qty 1

## 2019-10-13 MED ORDER — LABETALOL HCL 5 MG/ML IV SOLN: 10.0000 mg | INTRAVENOUS | Status: DC | PRN

## 2019-10-13 MED ORDER — POLYVINYL ALCOHOL 1.4 % OP SOLN
1.0000 [drp] | Freq: Four times a day (QID) | OPHTHALMIC | Status: DC | PRN
  Filled 2019-10-13: qty 15

## 2019-10-13 NOTE — Progress Notes (Signed)
   10/13/19 1650  Clinical Encounter Type  Visited With Patient and family together  Visit Type Initial  Referral From Nurse  Consult/Referral To Chaplain  Chaplain responded to OR for AD. When chaplain arrived at pt's room, her daughter was sitting at the bedside. Her daughter explained her mother wanted to be DNR. Pt wants the hospital to do all they can for her, but when it is her time, it's time. Pt has the form completed. Chaplain told daughter that we need 2 witnesses and a notary and will ask AM chaplain to orchestrate getting AD completed.

## 2019-10-13 NOTE — Progress Notes (Signed)
New order for Metoprolol 25mg  now.. Given to patient will tell ongoing nurse not to give 2200 metoprolol .

## 2019-10-13 NOTE — Progress Notes (Signed)
PROGRESS NOTE    Kristin Rowe  HAL:[id number] DOB: 03-16-1926 DOA: 10/12/2019 PCP: [name], MD   Brief Narrative:  HPI: Kristin Rowe is a 86 y.o. female with medical history significant for PVD, carotid stenosis s/p right endarterectomy, history of cervical cancer s/p radiation in 2011 and hypertension who presents with concerns of weakness and decreased p.o. intake.  Daughter at bedside provides most of the history as patient has hearing difficulties.  Patient lives alone but daughter lives next door to her.  For the past 4 days has not been wanting to eat or drink.  Has been complaining of nausea but denies any vomiting.  She is also been complaining of lower abdominal pain and today had several episodes of diarrhea.  Daughter has been checking her vital signs at home and noted that in the past couple days her heart rate has dropped down to the 40s but today it was up to 120s and she was very tremulous and weak so daughter decided to bring her in.  Patient denies any chest pain or shortness of breath.  Denies any fever.  I evaluated the patient and spoke with the patient's daughter at bedside.  Patient is frail and has had recurrent UTIs several over the past year.  That in combination with new onset atrial fibrillation prompted me to consult palliative care.  Appreciate assistance from palliative care NP.  Patient now DNR status.  Discussed about anticoagulation.  Will defer at this time.   Assessment & Plan:   Principal Problem:   Sepsis ([place]) Active Problems:   PVD (peripheral vascular disease) ([place])   Acute pyelonephritis   Atrial fibrillation with RVR ([facility])   Elevated troponin   Anemia  Sepsis secondary to pyelonephritis Continue IV Rocephin Has received 500 IV normal saline bolus in the ED Continuous maintenance fluids Follow cultures for pathogen identification and narrowing of antibiotics  Newly diagnosed atrial fibrillation Patient noted to be in atrial  fibrillation with RVR with EMS however this is now rate controlled in the ED.  Suspect could possibly due to infection. Continue home metoprolol Patient is poor candidate for anticoagulation CHA2DS2-VASc is 5 Had a lengthy discussion with the patient and the daughter at bedside Do not recommend anticoagulation at this time given fall risk and high likelihood of a life-threatening bleed Check echocardiogram We will consider cardiology consult pending echocardiogram results  Borderline soft blood pressure with history of hypertension Hold amlodipine and lisinopril  Elevated troponin 36 --> 30 likely demand ischemia from atrial fibrillation  Anemia Check iron panel, vitamin B12 and folate Patient does get vitamin B12 injections outpatient  PVD with history of carotid endarterectomy Continue aspirin   DVT prophylaxis: Lovenox Code Status: DNR Family Communication: Daughter at bedside  disposition Plan: Status is: Inpatient  Remains inpatient appropriate because:Inpatient level of care appropriate due to severity of illness   Dispo: The patient is from: Home              Anticipated d/c is to: Home              Anticipated d/c date is: 2 days              Patient currently is not medically stable to d/c.   Resolving sepsis secondary to pyelonephritis.  On IV antibiotics.  Anticipate 2 additional days of inpatient treatment and monitoring prior to disposition planning.      Consultants:   Palliative care  Procedures:   None  Antimicrobials:  Rocephin   Subjective: Seen and examined.  Daughter at bedside.  Patient in no visible distress.  Endorses some fatigue but no pain complaints.  Objective: Vitals:   10/13/19 0900 10/13/19 0915 10/13/19 1015 10/13/19 1030  BP: (!) 110/58 (!) 107/53 (!) 114/58 117/73  Pulse: 67 86 74 91  Resp: (!) 23 (!) 22 16 (!) 24  Temp:      TempSrc:      SpO2: 100% 100% (!) 80% 100%  Weight:      Height:         Intake/Output Summary (Last 24 hours) at 10/13/2019 1511 Last data filed at 10/12/2019 2341 Gross per 24 hour  Intake 846.67 ml  Output 100 ml  Net 746.67 ml   Filed Weights   10/12/19 1556  Weight: 54.4 kg    Examination:  General exam: No acute distress.  Appears younger than stated age Respiratory system: Clear to auscultation. Respiratory effort normal. Cardiovascular system: Tachycardic, regular rhythm, no murmurs Gastrointestinal system: Abdomen is nondistended, soft and nontender. No organomegaly or masses felt. Normal bowel sounds heard. Central nervous system: Alert and oriented. No focal neurological deficits. Extremities: Symmetric 5 x 5 power. Skin: No rashes, lesions or ulcers Psychiatry: Judgement and insight appear normal. Mood & affect appropriate.     Data Reviewed: I have personally reviewed following labs and imaging studies  CBC: Recent Labs  Lab 10/12/19 1612 10/13/19 0159  WBC 12.2* 11.4*  NEUTROABS 10.3*  --   HGB 10.3* 8.7*  HCT 32.3* 27.9*  MCV 90.0 90.3  PLT 91* 89*   Basic Metabolic Panel: Recent Labs  Lab 10/12/19 1612 10/13/19 0159  NA 130* 134*  K 3.8 3.4*  CL 94* 100  CO2 25 28  GLUCOSE 135* 120*  BUN 38* 30*  CREATININE 1.23* 1.05*  CALCIUM 8.0* 7.6*   GFR: Estimated Creatinine Clearance: 30 mL/min (A) (by C-G formula based on SCr of 1.05 mg/dL (H)). Liver Function Tests: Recent Labs  Lab 10/12/19 1612  AST 46*  ALT 20  ALKPHOS 71  BILITOT 1.3*  PROT 6.8  ALBUMIN 2.9*   Recent Labs  Lab 10/12/19 1612  LIPASE 30   No results for input(s): AMMONIA in the last 168 hours. Coagulation Profile: No results for input(s): INR, PROTIME in the last 168 hours. Cardiac Enzymes: No results for input(s): CKTOTAL, CKMB, CKMBINDEX, TROPONINI in the last 168 hours. BNP (last 3 results) No results for input(s): PROBNP in the last 8760 hours. HbA1C: No results for input(s): HGBA1C in the last 72 hours. CBG: No results  for input(s): GLUCAP in the last 168 hours. Lipid Profile: No results for input(s): CHOL, HDL, LDLCALC, TRIG, CHOLHDL, LDLDIRECT in the last 72 hours. Thyroid Function Tests: Recent Labs    10/13/19 0159  TSH 0.876   Anemia Panel: Recent Labs    10/13/19 0159  VITAMINB12 1,420*  FOLATE 8.7  TIBC 148*  IRON 9*   Sepsis Labs: Recent Labs  Lab 10/12/19 1612 10/12/19 1848  LATICACIDVEN 1.7 1.3    Recent Results (from the past 240 hour(s))  Respiratory Panel by RT PCR (Flu A&B, Covid) - Nasopharyngeal Swab     Status: None   Collection Time: 10/12/19  4:12 PM   Specimen: Nasopharyngeal Swab  Result Value Ref Range Status   SARS Coronavirus 2 by RT PCR NEGATIVE NEGATIVE Final    Comment: (NOTE) SARS-CoV-2 target nucleic acids are NOT DETECTED.  The SARS-CoV-2 RNA is generally detectable in upper respiratoy specimens during  the acute phase of infection. The lowest concentration of SARS-CoV-2 viral copies this assay can detect is 131 copies/mL. A negative result does not preclude SARS-Cov-2 infection and should not be used as the sole basis for treatment or other patient management decisions. A negative result may occur with  improper specimen collection/handling, submission of specimen other than nasopharyngeal swab, presence of viral mutation(s) within the areas targeted by this assay, and inadequate number of viral copies (<131 copies/mL). A negative result must be combined with clinical observations, patient history, and epidemiological information. The expected result is Negative.  Fact Sheet for Patients:  PinkCheek.be  Fact Sheet for Healthcare Providers:  GravelBags.it  This test is no t yet approved or cleared by the [place] FDA and  has been authorized for detection and/or diagnosis of SARS-CoV-2 by FDA under an Emergency Use Authorization (EUA). This EUA will remain  in effect (meaning this test  can be used) for the duration of the COVID-19 declaration under Section 564(b)(1) of the Act, 21 U.S.C. section 360bbb-3(b)(1), unless the authorization is terminated or revoked sooner.     Influenza A by PCR NEGATIVE NEGATIVE Final   Influenza B by PCR NEGATIVE NEGATIVE Final    Comment: (NOTE) The Xpert Xpress SARS-CoV-2/FLU/RSV assay is intended as an aid in  the diagnosis of influenza from Nasopharyngeal swab specimens and  should not be used as a sole basis for treatment. Nasal washings and  aspirates are unacceptable for Xpert Xpress SARS-CoV-2/FLU/RSV  testing.  Fact Sheet for Patients: PinkCheek.be  Fact Sheet for Healthcare Providers: GravelBags.it  This test is not yet approved or cleared by the [place] FDA and  has been authorized for detection and/or diagnosis of SARS-CoV-2 by  FDA under an Emergency Use Authorization (EUA). This EUA will remain  in effect (meaning this test can be used) for the duration of the  Covid-19 declaration under Section 564(b)(1) of the Act, 21  U.S.C. section 360bbb-3(b)(1), unless the authorization is  terminated or revoked. Performed at [facility], [address], [place], [place] [address]          Radiology Studies: CT Head Wo Contrast  Result Date: 10/12/2019 CLINICAL DATA:  Confusion, dysuria, TIA symptoms EXAM: CT HEAD WITHOUT CONTRAST TECHNIQUE: Contiguous axial images were obtained from the base of the skull through the vertex without intravenous contrast. COMPARISON:  09/22/2016, 09/24/2015 FINDINGS: Brain: No acute infarct or hemorrhage. Lateral ventricles and midline structures are unremarkable. There are no acute extra-axial fluid collections. There is no mass effect. Vascular: No hyperdense vessel or unexpected calcification. Skull: Normal. Negative for fracture or focal lesion. Sinuses/Orbits: No acute finding. Other: None. IMPRESSION: 1. No acute  intracranial process. Electronically Signed   By: Randa Ngo M.D.   On: 10/12/2019 17:50   CT ABDOMEN PELVIS W CONTRAST  Result Date: 10/12/2019 CLINICAL DATA:  Acute abdominal pain, dysuria EXAM: CT ABDOMEN AND PELVIS WITH CONTRAST TECHNIQUE: Multidetector CT imaging of the abdomen and pelvis was performed using the standard protocol following bolus administration of intravenous contrast. CONTRAST:  80mL OMNIPAQUE IOHEXOL 300 MG/ML  SOLN COMPARISON:  09/06/2019 FINDINGS: Lower chest: No acute pleural or parenchymal lung disease. Hepatobiliary: Noncalcified gallstones versus gallbladder sludge identified. No evidence of acute cholecystitis. Liver is unremarkable. Pancreas: Unremarkable. No pancreatic ductal dilatation or surrounding inflammatory changes. Spleen: Normal in size without focal abnormality. Adrenals/Urinary Tract: Heterogeneous decreased attenuation throughout the right renal parenchyma consistent with diffuse right-sided pyelonephritis. No evidence of renal abscess. Hyperenhancement of the right  ureteral mucosa consistent with infection. The left kidney enhances normally. No urinary tract calculi or obstruction. The bladder is mildly distended with no focal abnormality. Adrenals are normal. Stomach/Bowel: No bowel obstruction or ileus. Diverticulosis of the distal colon without diverticulitis. Normal retrocecal appendix. Vascular/Lymphatic: Aortic atherosclerosis. No enlarged abdominal or pelvic lymph nodes. Reproductive: Status post hysterectomy. No adnexal masses. Other: Trace pelvic free fluid, nonspecific. No free gas. No abdominal wall hernia. Musculoskeletal: No acute or destructive bony lesions. Reconstructed images demonstrate no additional findings. IMPRESSION: 1. Right-sided pyelonephritis.  No evidence of right renal abscess. 2. Noncalcified gallstones versus gallbladder sludge. No evidence of acute cholecystitis. 3. Trace pelvic free fluid, nonspecific. 4. Aortic Atherosclerosis  (ICD10-I70.0). Electronically Signed   By: Randa Ngo M.D.   On: 10/12/2019 17:48   DG Chest Portable 1 View  Result Date: 10/12/2019 CLINICAL DATA:  Atrial fibrillation.  Weakness today. EXAM: PORTABLE CHEST 1 VIEW COMPARISON:  No recent exams.  Chest radiograph 11/20/2008 reviewed FINDINGS: Mild cardiomegaly. Normal mediastinal contours. Aortic atherosclerosis. The lungs are hyperinflated with peribronchial thickening. There is biapical pleuroparenchymal scarring. No confluent airspace disease. No significant pleural effusion. No pneumothorax. No acute osseous abnormalities are seen. IMPRESSION: 1. Mild cardiomegaly.  Aortic Atherosclerosis (ICD10-I70.0). 2. Hyperinflation with peribronchial thickening, imaging findings suggesting COPD. Possibility of superimposed edema is raised. Electronically Signed   By: Keith Rake M.D.   On: 10/12/2019 16:40   ECHOCARDIOGRAM COMPLETE  Result Date: 10/13/2019    ECHOCARDIOGRAM REPORT   Patient Name:   LISBET BUSKER Date of Exam: 10/13/2019 Medical Rec #:  [id number]       Height:       65.0 in Accession #:    [id number]      Weight:       120.0 lb Date of Birth:  1926/07/02       BSA:          1.592 m Patient Age:    58 years        BP:           102/55 mmHg Patient Gender: F               HR:           65 bpm. Exam Location:  [facility] Procedure: 2D Echo, Cardiac Doppler and Color Doppler Indications:     Atrial Fibrillation 427.31  History:         Patient has no prior history of Echocardiogram examinations.                  COPD, Signs/Symptoms:Murmur; Risk Factors:Hypertension.  Sonographer:     Sherrie Sport RDCS (AE) Referring Phys:  [id number] Angela Burke TU Diagnosing Phys: Ida Rogue MD IMPRESSIONS  1. Left ventricular ejection fraction, by estimation, is 55 %. The left ventricle has normal function. The left ventricle has no regional wall motion abnormalities. Left ventricular diastolic parameters are indeterminate.  2. Right ventricular systolic function is  normal. The right ventricular size is mildly enlarged. There is moderately elevated pulmonary artery systolic pressure. The estimated right ventricular systolic pressure is 75.6 mmHg.  3. Left atrial size was moderately dilated.  4. Moderate mitral valve regurgitation.  5. Tricuspid valve regurgitation is moderate.  6. Aortic valve regurgitation is mild.  7. The inferior vena cava is normal in size with <50% respiratory variability, suggesting right atrial pressure of 8 mmHg.  8. Rhythm is atrial fibrillation FINDINGS  Left Ventricle: Left ventricular ejection fraction, by estimation, is  55 %. The left ventricle has normal function. The left ventricle has no regional wall motion abnormalities. The left ventricular internal cavity size was normal in size. There is no left ventricular hypertrophy. Left ventricular diastolic parameters are indeterminate. Right Ventricle: The right ventricular size is mildly enlarged. No increase in right ventricular wall thickness. Right ventricular systolic function is normal. There is moderately elevated pulmonary artery systolic pressure. The tricuspid regurgitant velocity is 3.04 m/s, and with an assumed right atrial pressure of 10 mmHg, the estimated right ventricular systolic pressure is 25.6 mmHg. Left Atrium: Left atrial size was moderately dilated. Right Atrium: Right atrial size was normal in size. Pericardium: There is no evidence of pericardial effusion. Mitral Valve: The mitral valve is normal in structure. Moderate mitral valve regurgitation. No evidence of mitral valve stenosis. Tricuspid Valve: The tricuspid valve is normal in structure. Tricuspid valve regurgitation is moderate . No evidence of tricuspid stenosis. Aortic Valve: The aortic valve is normal in structure. Aortic valve regurgitation is mild. Mild aortic valve sclerosis is present, with no evidence of aortic valve stenosis. Aortic valve mean gradient measures 3.7 mmHg. Aortic valve peak gradient measures 7.2  mmHg. Aortic valve area, by VTI measures 2.51 cm. Pulmonic Valve: The pulmonic valve was normal in structure. Pulmonic valve regurgitation is not visualized. No evidence of pulmonic stenosis. Aorta: The aortic root is normal in size and structure. Venous: The inferior vena cava is normal in size with less than 50% respiratory variability, suggesting right atrial pressure of 8 mmHg. IAS/Shunts: No atrial level shunt detected by color flow Doppler.  LEFT VENTRICLE PLAX 2D LVIDd:         4.04 cm LVIDs:         2.53 cm LV PW:         1.21 cm LV IVS:        0.85 cm LVOT diam:     2.00 cm LV SV:         46 LV SV Index:   29 LVOT Area:     3.14 cm  RIGHT VENTRICLE RV Basal diam:  3.80 cm RV S prime:     18.00 cm/s TAPSE (M-mode): 4.1 cm LEFT ATRIUM             Index       RIGHT ATRIUM           Index LA diam:        3.20 cm 2.01 cm/m  RA Area:     18.60 cm LA Vol (A2C):   81.9 ml 51.44 ml/m RA Volume:   51.40 ml  32.28 ml/m LA Vol (A4C):   54.3 ml 34.11 ml/m LA Biplane Vol: 66.8 ml 41.96 ml/m  AORTIC VALVE                   PULMONIC VALVE AV Area (Vmax):    1.76 cm    PV Vmax:        0.73 m/s AV Area (Vmean):   1.90 cm    PV Peak grad:   2.1 mmHg AV Area (VTI):     2.51 cm    RVOT Peak grad: 3 mmHg AV Vmax:           134.33 cm/s AV Vmean:          87.967 cm/s AV VTI:            0.181 m AV Peak Grad:      7.2 mmHg AV Mean Grad:  3.7 mmHg LVOT Vmax:         75.10 cm/s LVOT Vmean:        53.300 cm/s LVOT VTI:          0.145 m LVOT/AV VTI ratio: 0.80  AORTA Ao Root diam: 3.00 cm MITRAL VALVE                TRICUSPID VALVE MV Area (PHT): 5.97 cm     TR Peak grad:   37.0 mmHg MV Decel Time: 127 msec     TR Vmax:        304.00 cm/s MV E velocity: 102.00 cm/s                             SHUNTS                             Systemic VTI:  0.14 m                             Systemic Diam: 2.00 cm Ida Rogue MD Electronically signed by Ida Rogue MD Signature Date/Time: 10/13/2019/2:34:36 PM    Final          Scheduled Meds: . Derrill Memo ON 10/14/2019] aspirin EC  81 mg Oral QODAY  . conjugated estrogens  1 Applicatorful Vaginal Daily  . enoxaparin (LOVENOX) injection  30 mg Subcutaneous Q24H  . methimazole  5 mg Oral Daily  . mirtazapine  15 mg Oral QHS  . pantoprazole  40 mg Oral Daily   Continuous Infusions: . cefTRIAXone (ROCEPHIN)  IV Stopped (10/12/19 2341)     LOS: 1 day    Time spent: 25 minutes    Sidney Ace, MD [facility] Pager [phone]  If 7PM-7AM, please contact night-coverage 10/13/2019, 3:11 PM

## 2019-10-13 NOTE — Progress Notes (Signed)
Continued MEWS Yellow     10/13/19 1636  Assess: MEWS Score  Pulse Rate (!) 114  SpO2 95 %  Assess: MEWS Score  MEWS Temp 0  MEWS Systolic 0  MEWS Pulse 2  MEWS RR 0  MEWS LOC 0  MEWS Score 2  MEWS Score Color Yellow  Assess: if the MEWS score is Yellow or Red  Were vital signs taken at a resting state? Yes  Focused Assessment No change from prior assessment  Early Detection of Sepsis Score *See Row Information* High  MEWS guidelines implemented *See Row Information* No, previously yellow, continue vital signs every 4 hours  Treat  MEWS Interventions Other (Comment) (secure chat about HR)  Pain Scale 0-10

## 2019-10-13 NOTE — Consult Note (Signed)
Consultation Note Date: 10/13/2019   Patient Name: Kristin Rowe  DOB: [birth year]  MRN: [id number]  Age / Sex: 86 y.o., female   PCP: [name], MD Referring Physician: Sidney Ace, MD   REASON FOR CONSULTATION:Establishing goals of care  Palliative Care consult requested for goals of care discussion in this 86 y.o. female with multiple medical problems including PVD, carotid stenosis s/p right endarterectomy, history of cervical cancer s/p radiation in 2011, COPD, hyperthyroidism, and hypertension. She presented to the ED with complaints of decreased p.o. intake and generalized weakness. Patient's daughters reported patient had a poor appetite for 4 days prior to admission. During work-up patient found to have new onset atrial fibrillation. Now controlled. Determined not to be best candidate for anticoagulation given high fall risk. BNP 656. UA showed many bacteria. CT of abdomen/pelvis showed right-sided pyelonephritis. Patient has been started on antibiotics.   Clinical Assessment and Goals of Care: I have reviewed medical records including lab results, imaging, Epic notes, and MAR, received report from the bedside RN, and assessed the patient. I met at the bedside with [name] and her daughters Butch Penny and [name] (via speaker phone)  to discuss diagnosis prognosis, [place], EOL wishes, disposition and options.  Patient is awake, alert and oriented. She is hard of hearing.   I introduced Palliative Medicine as specialized medical care for people living with serious illness. It focuses on providing relief from the symptoms and stress of a serious illness. The goal is to improve quality of life for both the patient and the family. Family verbalized understanding and appreciation of our support.   We discussed a brief life review of the patient, along with her functional and nutritional status. Patient reports she has 2 daughters. She is widowed. Retired as a Engineer, manufacturing systems. Is  of Christian faith. She currently lives alone and her daughter, lives next to her.   Prior to admission patient and daughters share patient's quality of life was "great!" Patient lived alone, was able to perform daily house maintenance. Daughter shares that patient made her bed daily and would clean. She was able to cut her own lawn up until about 2 years ago. Family performs most errands and handles meals for patient. She does not utilize any assistive devices in the home however, she does use a cane when outside of the home for stability. Family does endorse some weight loss up to 10lbs since June 2021. Her appetite is fair with some noticeable changes recently. Patient reports over the past several weeks she has noticed a change in energy with increased fatigue and more frequent rest breaks.    We discussed Her current illness and what it means in the larger context of Her on-going co-morbidities. Natural disease trajectory and expectations at EOL were discussed.  Patient and family verbalizes understanding of patient's current illness and condition. They remain hopeful for some improvement and that patient will eventually return home with family support.   I attempted to elicit values and goals of care important to the patient.    [name] and her daughter are clear in their expressed goals to treat the treatable with every opportunity to allow her a chance to improve and continue to thrive. They share their perception of patient's great quality of life and hopes this will continue. Family is realistic in their expectations and older age. They are clear that if patient's quality of life was drastically decreased, she showed no meaningful improvement the goal of  care would be to focus on her comfort and happiness.   Patient and her daughters states quality of life is most important versus quantity.   Advanced directives, concepts specific to code status, artifical feeding and hydration, and  rehospitalization were considered and discussed. Patient does not think she has a documented advanced directive. She does have her daughters listed as her durable power of attorneys together but unsure if it addressed the health care aspect. I discussed the importance of completion of AD and acknowledging her wishes while able. Patient and family verbalized understanding and appreciation. [name] reports she would like to complete her AD while hospitalized. She indicates both of her daughters would be her HCPOAs   I discussed at length with patient and her daughter patient's full code status with consideration of her current illnesses, co-morbidities, and advanced age. Patient and family verbalized understanding. [name] reports she would like to continue to treat the treatable however would not want any life-sustaining measures, at that point she would like to be allowed to pass away naturally! I provided education to family and patient based on expressed wishes recommendations for DNR/DNI which aligned. Patient and daughters verbalized understanding and appreciation, again confirming wishes for DNR/DNI. Patient also would not want to have artificial feedings/PEG or any other forms of life-sustaining measures such as dialysis.   Advanced directive packet provided to patient and daughter and discussed in detail. I also introduced and provided education to them on the MOST form.   I completed a MOST form today with patient and her daughters, [name] and Butch Penny. The patient and family outlined their wishes for the following treatment decisions:  Cardiopulmonary Resuscitation: Do Not Attempt Resuscitation (DNR/No CPR)  Medical Interventions: Comfort Measures: Keep clean, warm, and dry. Use medication by any route, positioning, wound care, and other measures to relieve pain and suffering. Use oxygen, suction and manual treatment of airway obstruction as needed for comfort. Do not transfer to the hospital  unless comfort needs cannot be met in current location.  Antibiotics: Determine use of limitation of antibiotics when infection occurs  IV Fluids: IV fluids for a defined trial period  Feeding Tube: No feeding tube   Family shares patient does not care for anyone in her home at this time. I would recommend outpatient palliative support however, if patient is not comfortable may benefit from a more virtual or telephonic support of palliative.    Questions and concerns were addressed.  Advanced Directive packet left for review. The family was encouraged to call with questions or concerns.  PMT will continue to support holistically.   SOCIAL HISTORY:     reports that she quit smoking about 34 years ago. She has never used smokeless tobacco. She reports that she does not drink alcohol and does not use drugs.  CODE STATUS: DNR  ADVANCE DIRECTIVES: Primary Decision Maker: Claris Gower and Elby Showers    SYMPTOM MANAGEMENT: per attending   Palliative Prophylaxis:   Aspiration, Delirium Protocol and Frequent Pain Assessment  PSYCHO-SOCIAL/SPIRITUAL:  Support System: Family  Desire for further Chaplaincy support:yes  Additional Recommendations (Limitations, Scope, Preferences):  No Artificial Feeding, No Hemodialysis, No Tracheostomy and continue to treat the treatable, DNR/DNI  Education on hospice/palliative    PAST MEDICAL HISTORY: Past Medical History:  Diagnosis Date  . Anxiety   . Arthritis   . Back pain    chronic  . Cancer [facility])    Vaginal - radiation treatments/mohs for melanoma left eyelid  . COPD (chronic obstructive  pulmonary disease) ([place])   . Dizziness    in am  . Heart murmur   . Hypertension   . Hyperthyroidism   . Peripheral vascular disease ([facility])     ALLERGIES:  is allergic to daypro [oxaprozin], morphine and related, plaquenil [hydroxychloroquine sulfate], and ridaura [auranofin].   MEDICATIONS:  Current Facility-Administered Medications    Medication Dose Route Frequency Provider Last Rate Last Admin  . [START ON 10/14/2019] aspirin EC tablet 81 mg  81 mg Oral QODAY Tu, Ching T, DO      . cefTRIAXone (ROCEPHIN) 2 g in sodium chloride 0.9 % 100 mL IVPB  2 g Intravenous Q24H Tu, Ching T, DO   Stopped at 10/12/19 2341  . conjugated estrogens (PREMARIN) vaginal cream 1 Applicatorful  1 Applicatorful Vaginal Daily Tu, Ching T, DO   1 Applicatorful at 74/25/95 1036  . enoxaparin (LOVENOX) injection 30 mg  30 mg Subcutaneous Q24H Tu, Ching T, DO   30 mg at 10/12/19 2344  . labetalol (NORMODYNE) injection 10 mg  10 mg Intravenous Q4H PRN Sreenath, Sudheer B, MD      . methimazole (TAPAZOLE) tablet 5 mg  5 mg Oral Daily Tu, Ching T, DO   5 mg at 10/13/19 1032  . mirtazapine (REMERON) tablet 15 mg  15 mg Oral QHS Tu, Ching T, DO      . pantoprazole (PROTONIX) EC tablet 40 mg  40 mg Oral Daily Tu, Ching T, DO   40 mg at 10/13/19 1032   Current Outpatient Medications  Medication Sig Dispense Refill  . ALPRAZolam (XANAX) 0.25 MG tablet Take 0.25 mg by mouth 3 (three) times daily as needed for anxiety.    Marland Kitchen amLODipine (NORVASC) 2.5 MG tablet Take 2.5 mg by mouth 2 (two) times daily. Takes 2 to = 5 mg    . aspirin EC 81 MG tablet Take 81 mg every other day by mouth.     . COD LIVER OIL PO Take by mouth. Every other day    . conjugated estrogens (PREMARIN) vaginal cream Place 1 Applicatorful vaginally daily. Apply 0.34m (pea-sized amount)  just inside the vaginal introitus with a finger-tip on  Monday, Wednesday and Friday nights. 30 g 12  . lisinopril (PRINIVIL,ZESTRIL) 20 MG tablet Take 20 mg by mouth 2 (two) times daily.     . methimazole (TAPAZOLE) 5 MG tablet Take 5 mg by mouth daily.    . metoprolol (LOPRESSOR) 100 MG tablet Take 100 mg by mouth 2 (two) times daily.     . mirtazapine (REMERON) 15 MG tablet Take 15 mg by mouth at bedtime.     . pantoprazole (PROTONIX) 40 MG tablet Take 40 mg by mouth daily.    . Clobetasol Prop Emollient  Base (CLOBETASOL PROPIONATE E) 0.05 % emollient cream Apply 1 application topically 2 (two) times daily. 30 g 11  . Lidocaine 2 % GEL Apply 1 Act topically as needed. 5 g 3    VITAL SIGNS: BP 124/71   Pulse 99   Temp 98.8 F (37.1 C) (Oral)   Resp (!) 21   Ht '5\' 5"'  (1.651 m)   Wt 54.4 kg   SpO2 95%   BMI 19.97 kg/m  Filed Weights   10/12/19 1556  Weight: 54.4 kg    Estimated body mass index is 19.97 kg/m as calculated from the following:   Height as of this encounter: '5\' 5"'  (1.651 m).   Weight as of this encounter: 54.4 kg.  LABS: CBC:  Component Value Date/Time   WBC 11.4 (H) 10/13/2019 0159   HGB 8.7 (L) 10/13/2019 0159   HCT 27.9 (L) 10/13/2019 0159   PLT 89 (L) 10/13/2019 0159   Comprehensive Metabolic Panel:    Component Value Date/Time   NA 134 (L) 10/13/2019 0159   K 3.4 (L) 10/13/2019 0159   BUN 30 (H) 10/13/2019 0159   BUN 21 12/02/2016 1526   CREATININE 1.05 (H) 10/13/2019 0159   ALBUMIN 2.9 (L) 10/12/2019 1612     Review of Systems Unless otherwise noted, a complete review of systems is negative.  Physical Exam General: NAD, frail  Cardiovascular: regular rate and rhythm Pulmonary: normal breathing pattern, cleat bilaterally    Neurological: alert and oriented x3, hard of hearing, mood appropriate, follows command  Prognosis: Guarded   Discharge Planning:  To Be Determined  Recommendations: . DNR/DNI-as requested/confirmed by patient and her daughters. Education on advanced directive/MOST. AD packet left at bedside (Spiritual support consult to assist with completion). MOST completed/DNR form completed and placed on chart  . Continue with current plan of care per medical team, treat the treatable per patient /family . Family and patient remains hopeful for improvement/stability. Goals is for patient to return home eventually with family support. Open to rehab if needed. Patient's QOL good prior to admission. Family and patient realistic in  expectations, if no meaningful recovery/improvement they would want care to focus on comfort and happiness.  . Would recommend outpatient palliative support. However, patient does not care for home visitors at this time.  Marland Kitchen PMT will continue to support and follow. Please call team line with urgent needs.   Palliative Performance Scale: PPS 30%               Patient and daughters expressed understanding and was in agreement with this plan.   Thank you for allowing the Palliative Medicine Team to assist in the care of this patient.  Time In: 1410 Time Out: 1515 Time Total: 65 min.   Visit consisted of counseling and education dealing with the complex and emotionally intense issues of symptom management and palliative care in the setting of serious and potentially life-threatening illness.Greater than 50%  of this time was spent counseling and coordinating care related to the above assessment and plan.  Signed by:  Alda Lea, AGPCNP-BC Palliative Medicine Team  Phone: [phone] Pager: [phone] Amion: Bjorn Pippin

## 2019-10-13 NOTE — Progress Notes (Signed)
*  PRELIMINARY RESULTS* Echocardiogram 2D Echocardiogram has been performed.  Kristin Rowe 10/13/2019, 12:04 PM

## 2019-10-13 NOTE — Progress Notes (Signed)
DNR bracelet placed to right wrist.

## 2019-10-13 NOTE — ED Notes (Signed)
Transferred pt to room 33. RN Mimi in room when left.

## 2019-10-13 NOTE — ED Notes (Signed)
Pt assisted to bedside toilet at this time. Pt 2 person assist at this time. Pt alert and able to eat her breakfast. Daughter at bedside. Pt also given bed bath at this time.

## 2019-10-14 LAB — URINALYSIS, COMPLETE (UACMP) WITH MICROSCOPIC
Bacteria, UA: NONE SEEN
Bilirubin Urine: NEGATIVE
Glucose, UA: NEGATIVE mg/dL
Ketones, ur: NEGATIVE mg/dL
Nitrite: NEGATIVE
Protein, ur: 100 mg/dL — AB
RBC / HPF: >50 RBC/hpf — ABNORMAL HIGH (ref 0–5)
Specific Gravity, Urine: 1.016 (ref 1.005–1.030)
WBC, UA: >50 WBC/hpf — ABNORMAL HIGH (ref 0–5)
pH: 6 (ref 5.0–8.0)

## 2019-10-14 MED ORDER — ESTROGENS, CONJUGATED 0.625 MG/GM VA CREA
1.0000 | TOPICAL_CREAM | VAGINAL | Status: DC
  Administered 2019-10-16: 1 via VAGINAL
  Filled 2019-10-14: qty 30

## 2019-10-14 NOTE — Progress Notes (Signed)
PROGRESS NOTE    Kristin Rowe  JJH:[id number] DOB: [birth year] DOA: 10/12/2019 PCP: [name], MD   Brief Narrative:  HPI: Kristin Rowe is a 86 y.o. female with medical history significant for PVD, carotid stenosis s/p right endarterectomy, history of cervical cancer s/p radiation in 2011 and hypertension who presents with concerns of weakness and decreased p.o. intake.  Daughter at bedside provides most of the history as patient has hearing difficulties.  Patient lives alone but daughter lives next door to her.  For the past 4 days has not been wanting to eat or drink.  Has been complaining of nausea but denies any vomiting.  She is also been complaining of lower abdominal pain and today had several episodes of diarrhea.  Daughter has been checking her vital signs at home and noted that in the past couple days her heart rate has dropped down to the 40s but today it was up to 120s and she was very tremulous and weak so daughter decided to bring her in.  Patient denies any chest pain or shortness of breath.  Denies any fever.  I evaluated the patient and spoke with the patient's daughter at bedside.  Patient is frail and has had recurrent UTIs several over the past year.  That in combination with new onset atrial fibrillation prompted me to consult palliative care.  Appreciate assistance from palliative care NP.  Patient now DNR status.  Discussed about anticoagulation.  Will defer at this time.  Patient's appetite slowly picking up.  No fevers over interval.  No urine culture was sent from initial UA.  This has been ordered as an add-on.   Assessment & Plan:   Principal Problem:   Sepsis ([place]) Active Problems:   PVD (peripheral vascular disease) ([place])   Acute pyelonephritis   Atrial fibrillation with RVR ([facility])   Elevated troponin   Anemia  Sepsis secondary to pyelonephritis Continue IV Rocephin Has received 500 IV normal saline bolus in the ED Patient's p.o. intake is  picking up.  Hold maintenance fluids Follow cultures for pathogen identification and narrowing of antibiotics No reflex urine culture could be found.  Repeat culture ordered on drawn urine  Newly diagnosed atrial fibrillation Patient noted to be in atrial fibrillation with RVR with EMS however this is now rate controlled in the ED.  Suspect could possibly due to infection. Continue home metoprolol Patient is poor candidate for anticoagulation CHA2DS2-VASc is 5 Had a lengthy discussion with the patient and the daughter at bedside Do not recommend anticoagulation at this time given fall risk and high likelihood of a life-threatening bleed Check echocardiogram-normal EF.  Diastolic function indeterminate.  No WMA Recommend outpatient follow-up with cardiology, Dr. Ubaldo Glassing  Borderline soft blood pressure with history of hypertension Hold amlodipine and lisinopril Continue metoprolol  Elevated troponin 36 --> 30 likely demand ischemia from atrial fibrillation  Anemia Check iron panel, vitamin B12 and folate-within normal limits Patient does get vitamin B12 injections outpatient  PVD with history of carotid endarterectomy Continue aspirin   DVT prophylaxis: Lovenox Code Status: DNR Family Communication: Daughter at bedside, 10/14/2019 disposition Plan: Status is: Inpatient  Remains inpatient appropriate because:Inpatient level of care appropriate due to severity of illness   Dispo: The patient is from: Home              Anticipated d/c is to: Home              Anticipated d/c date is: 1 day  Patient currently is not medically stable to d/c.   Still mildly symptomatic secondary to acute pyelonephritis.  Urine culture pending.  Therapy evaluations requested.  Tentative plan to discharge home 10/15/2019  Consultants:   Palliative care  Procedures:   None  Antimicrobials:   Rocephin   Subjective: Seen and examined.  Daughter at bedside.  P.o. intake is  slowly improving.  Still complaining of some right left flank pain  Objective: Vitals:   10/14/19 0023 10/14/19 0424 10/14/19 0828 10/14/19 0904  BP: (!) 85/49 (!) 110/54 (!) 116/55   Pulse: 94 98 67 (!) 103  Resp: 20 20 16    Temp: 98 F (36.7 C) 99.3 F (37.4 C) 98.3 F (36.8 C)   TempSrc:  Oral Oral   SpO2: 93% 95% 93%   Weight:      Height:        Intake/Output Summary (Last 24 hours) at 10/14/2019 1050 Last data filed at 10/14/2019 0700 Gross per 24 hour  Intake 200 ml  Output 250 ml  Net -50 ml   Filed Weights   10/12/19 1556  Weight: 54.4 kg    Examination:   General exam: No acute distress.  Appears younger than stated age Respiratory system: Clear to auscultation. Respiratory effort normal. Cardiovascular system: Tachycardic, regular rhythm, no murmurs Gastrointestinal system: Abdomen nondistended.  Mild tender to palpation left and right flank.  Positive bowel sounds Central nervous system: Alert and oriented x2. No focal neurological deficits. Extremities: Symmetric 5 x 5 power. Skin: No rashes, lesions or ulcers Psychiatry: Judgement and insight appear normal. Mood & affect appropriate.    Data Reviewed: I have personally reviewed following labs and imaging studies  CBC: Recent Labs  Lab 10/12/19 1612 10/13/19 0159  WBC 12.2* 11.4*  NEUTROABS 10.3*  --   HGB 10.3* 8.7*  HCT 32.3* 27.9*  MCV 90.0 90.3  PLT 91* 89*   Basic Metabolic Panel: Recent Labs  Lab 10/12/19 1612 10/13/19 0159  NA 130* 134*  K 3.8 3.4*  CL 94* 100  CO2 25 28  GLUCOSE 135* 120*  BUN 38* 30*  CREATININE 1.23* 1.05*  CALCIUM 8.0* 7.6*   GFR: Estimated Creatinine Clearance: 30 mL/min (A) (by C-G formula based on SCr of 1.05 mg/dL (H)). Liver Function Tests: Recent Labs  Lab 10/12/19 1612  AST 46*  ALT 20  ALKPHOS 71  BILITOT 1.3*  PROT 6.8  ALBUMIN 2.9*   Recent Labs  Lab 10/12/19 1612  LIPASE 30   No results for input(s): AMMONIA in the last 168  hours. Coagulation Profile: No results for input(s): INR, PROTIME in the last 168 hours. Cardiac Enzymes: No results for input(s): CKTOTAL, CKMB, CKMBINDEX, TROPONINI in the last 168 hours. BNP (last 3 results) No results for input(s): PROBNP in the last 8760 hours. HbA1C: No results for input(s): HGBA1C in the last 72 hours. CBG: No results for input(s): GLUCAP in the last 168 hours. Lipid Profile: No results for input(s): CHOL, HDL, LDLCALC, TRIG, CHOLHDL, LDLDIRECT in the last 72 hours. Thyroid Function Tests: Recent Labs    10/13/19 0159  TSH 0.876   Anemia Panel: Recent Labs    10/13/19 0159  VITAMINB12 1,420*  FOLATE 8.7  TIBC 148*  IRON 9*   Sepsis Labs: Recent Labs  Lab 10/12/19 1612 10/12/19 1848  LATICACIDVEN 1.7 1.3    Recent Results (from the past 240 hour(s))  Respiratory Panel by RT PCR (Flu A&B, Covid) - Nasopharyngeal Swab     Status:  None   Collection Time: 10/12/19  4:12 PM   Specimen: Nasopharyngeal Swab  Result Value Ref Range Status   SARS Coronavirus 2 by RT PCR NEGATIVE NEGATIVE Final    Comment: (NOTE) SARS-CoV-2 target nucleic acids are NOT DETECTED.  The SARS-CoV-2 RNA is generally detectable in upper respiratoy specimens during the acute phase of infection. The lowest concentration of SARS-CoV-2 viral copies this assay can detect is 131 copies/mL. A negative result does not preclude SARS-Cov-2 infection and should not be used as the sole basis for treatment or other patient management decisions. A negative result may occur with  improper specimen collection/handling, submission of specimen other than nasopharyngeal swab, presence of viral mutation(s) within the areas targeted by this assay, and inadequate number of viral copies (<131 copies/mL). A negative result must be combined with clinical observations, patient history, and epidemiological information. The expected result is Negative.  Fact Sheet for Patients:    PinkCheek.be  Fact Sheet for Healthcare Providers:  GravelBags.it  This test is no t yet approved or cleared by the [place] FDA and  has been authorized for detection and/or diagnosis of SARS-CoV-2 by FDA under an Emergency Use Authorization (EUA). This EUA will remain  in effect (meaning this test can be used) for the duration of the COVID-19 declaration under Section 564(b)(1) of the Act, 21 U.S.C. section 360bbb-3(b)(1), unless the authorization is terminated or revoked sooner.     Influenza A by PCR NEGATIVE NEGATIVE Final   Influenza B by PCR NEGATIVE NEGATIVE Final    Comment: (NOTE) The Xpert Xpress SARS-CoV-2/FLU/RSV assay is intended as an aid in  the diagnosis of influenza from Nasopharyngeal swab specimens and  should not be used as a sole basis for treatment. Nasal washings and  aspirates are unacceptable for Xpert Xpress SARS-CoV-2/FLU/RSV  testing.  Fact Sheet for Patients: PinkCheek.be  Fact Sheet for Healthcare Providers: GravelBags.it  This test is not yet approved or cleared by the [place] FDA and  has been authorized for detection and/or diagnosis of SARS-CoV-2 by  FDA under an Emergency Use Authorization (EUA). This EUA will remain  in effect (meaning this test can be used) for the duration of the  Covid-19 declaration under Section 564(b)(1) of the Act, 21  U.S.C. section 360bbb-3(b)(1), unless the authorization is  terminated or revoked. Performed at [facility], [address], [place], [place] [address]          Radiology Studies: CT Head Wo Contrast  Result Date: 10/12/2019 CLINICAL DATA:  Confusion, dysuria, TIA symptoms EXAM: CT HEAD WITHOUT CONTRAST TECHNIQUE: Contiguous axial images were obtained from the base of the skull through the vertex without intravenous contrast. COMPARISON:  09/22/2016,  09/24/2015 FINDINGS: Brain: No acute infarct or hemorrhage. Lateral ventricles and midline structures are unremarkable. There are no acute extra-axial fluid collections. There is no mass effect. Vascular: No hyperdense vessel or unexpected calcification. Skull: Normal. Negative for fracture or focal lesion. Sinuses/Orbits: No acute finding. Other: None. IMPRESSION: 1. No acute intracranial process. Electronically Signed   By: Randa Ngo M.D.   On: 10/12/2019 17:50   CT ABDOMEN PELVIS W CONTRAST  Result Date: 10/12/2019 CLINICAL DATA:  Acute abdominal pain, dysuria EXAM: CT ABDOMEN AND PELVIS WITH CONTRAST TECHNIQUE: Multidetector CT imaging of the abdomen and pelvis was performed using the standard protocol following bolus administration of intravenous contrast. CONTRAST:  20mL OMNIPAQUE IOHEXOL 300 MG/ML  SOLN COMPARISON:  09/06/2019 FINDINGS: Lower chest: No acute pleural or parenchymal lung disease.  Hepatobiliary: Noncalcified gallstones versus gallbladder sludge identified. No evidence of acute cholecystitis. Liver is unremarkable. Pancreas: Unremarkable. No pancreatic ductal dilatation or surrounding inflammatory changes. Spleen: Normal in size without focal abnormality. Adrenals/Urinary Tract: Heterogeneous decreased attenuation throughout the right renal parenchyma consistent with diffuse right-sided pyelonephritis. No evidence of renal abscess. Hyperenhancement of the right ureteral mucosa consistent with infection. The left kidney enhances normally. No urinary tract calculi or obstruction. The bladder is mildly distended with no focal abnormality. Adrenals are normal. Stomach/Bowel: No bowel obstruction or ileus. Diverticulosis of the distal colon without diverticulitis. Normal retrocecal appendix. Vascular/Lymphatic: Aortic atherosclerosis. No enlarged abdominal or pelvic lymph nodes. Reproductive: Status post hysterectomy. No adnexal masses. Other: Trace pelvic free fluid, nonspecific. No free  gas. No abdominal wall hernia. Musculoskeletal: No acute or destructive bony lesions. Reconstructed images demonstrate no additional findings. IMPRESSION: 1. Right-sided pyelonephritis.  No evidence of right renal abscess. 2. Noncalcified gallstones versus gallbladder sludge. No evidence of acute cholecystitis. 3. Trace pelvic free fluid, nonspecific. 4. Aortic Atherosclerosis (ICD10-I70.0). Electronically Signed   By: Randa Ngo M.D.   On: 10/12/2019 17:48   DG Chest Portable 1 View  Result Date: 10/12/2019 CLINICAL DATA:  Atrial fibrillation.  Weakness today. EXAM: PORTABLE CHEST 1 VIEW COMPARISON:  No recent exams.  Chest radiograph 11/20/2008 reviewed FINDINGS: Mild cardiomegaly. Normal mediastinal contours. Aortic atherosclerosis. The lungs are hyperinflated with peribronchial thickening. There is biapical pleuroparenchymal scarring. No confluent airspace disease. No significant pleural effusion. No pneumothorax. No acute osseous abnormalities are seen. IMPRESSION: 1. Mild cardiomegaly.  Aortic Atherosclerosis (ICD10-I70.0). 2. Hyperinflation with peribronchial thickening, imaging findings suggesting COPD. Possibility of superimposed edema is raised. Electronically Signed   By: Keith Rake M.D.   On: 10/12/2019 16:40   ECHOCARDIOGRAM COMPLETE  Result Date: 10/13/2019    ECHOCARDIOGRAM REPORT   Patient Name:   SHALAYNE LEACH Date of Exam: 10/13/2019 Medical Rec #:  [id number]       Height:       65.0 in Accession #:    [id number]      Weight:       120.0 lb Date of Birth:  [birth year]       BSA:          1.592 m Patient Age:    48 years        BP:           102/55 mmHg Patient Gender: F               HR:           65 bpm. Exam Location:  [facility] Procedure: 2D Echo, Cardiac Doppler and Color Doppler Indications:     Atrial Fibrillation 427.31  History:         Patient has no prior history of Echocardiogram examinations.                  COPD, Signs/Symptoms:Murmur; Risk Factors:Hypertension.   Sonographer:     Sherrie Sport RDCS (AE) Referring Phys:  [id number] Angela Burke TU Diagnosing Phys: Ida Rogue MD IMPRESSIONS  1. Left ventricular ejection fraction, by estimation, is 55 %. The left ventricle has normal function. The left ventricle has no regional wall motion abnormalities. Left ventricular diastolic parameters are indeterminate.  2. Right ventricular systolic function is normal. The right ventricular size is mildly enlarged. There is moderately elevated pulmonary artery systolic pressure. The estimated right ventricular systolic pressure is 21.1 mmHg.  3. Left atrial size was moderately dilated.  4.  Moderate mitral valve regurgitation.  5. Tricuspid valve regurgitation is moderate.  6. Aortic valve regurgitation is mild.  7. The inferior vena cava is normal in size with <50% respiratory variability, suggesting right atrial pressure of 8 mmHg.  8. Rhythm is atrial fibrillation FINDINGS  Left Ventricle: Left ventricular ejection fraction, by estimation, is 55 %. The left ventricle has normal function. The left ventricle has no regional wall motion abnormalities. The left ventricular internal cavity size was normal in size. There is no left ventricular hypertrophy. Left ventricular diastolic parameters are indeterminate. Right Ventricle: The right ventricular size is mildly enlarged. No increase in right ventricular wall thickness. Right ventricular systolic function is normal. There is moderately elevated pulmonary artery systolic pressure. The tricuspid regurgitant velocity is 3.04 m/s, and with an assumed right atrial pressure of 10 mmHg, the estimated right ventricular systolic pressure is 17.4 mmHg. Left Atrium: Left atrial size was moderately dilated. Right Atrium: Right atrial size was normal in size. Pericardium: There is no evidence of pericardial effusion. Mitral Valve: The mitral valve is normal in structure. Moderate mitral valve regurgitation. No evidence of mitral valve stenosis. Tricuspid  Valve: The tricuspid valve is normal in structure. Tricuspid valve regurgitation is moderate . No evidence of tricuspid stenosis. Aortic Valve: The aortic valve is normal in structure. Aortic valve regurgitation is mild. Mild aortic valve sclerosis is present, with no evidence of aortic valve stenosis. Aortic valve mean gradient measures 3.7 mmHg. Aortic valve peak gradient measures 7.2 mmHg. Aortic valve area, by VTI measures 2.51 cm. Pulmonic Valve: The pulmonic valve was normal in structure. Pulmonic valve regurgitation is not visualized. No evidence of pulmonic stenosis. Aorta: The aortic root is normal in size and structure. Venous: The inferior vena cava is normal in size with less than 50% respiratory variability, suggesting right atrial pressure of 8 mmHg. IAS/Shunts: No atrial level shunt detected by color flow Doppler.  LEFT VENTRICLE PLAX 2D LVIDd:         4.04 cm LVIDs:         2.53 cm LV PW:         1.21 cm LV IVS:        0.85 cm LVOT diam:     2.00 cm LV SV:         46 LV SV Index:   29 LVOT Area:     3.14 cm  RIGHT VENTRICLE RV Basal diam:  3.80 cm RV S prime:     18.00 cm/s TAPSE (M-mode): 4.1 cm LEFT ATRIUM             Index       RIGHT ATRIUM           Index LA diam:        3.20 cm 2.01 cm/m  RA Area:     18.60 cm LA Vol (A2C):   81.9 ml 51.44 ml/m RA Volume:   51.40 ml  32.28 ml/m LA Vol (A4C):   54.3 ml 34.11 ml/m LA Biplane Vol: 66.8 ml 41.96 ml/m  AORTIC VALVE                   PULMONIC VALVE AV Area (Vmax):    1.76 cm    PV Vmax:        0.73 m/s AV Area (Vmean):   1.90 cm    PV Peak grad:   2.1 mmHg AV Area (VTI):     2.51 cm    RVOT Peak grad: 3  mmHg AV Vmax:           134.33 cm/s AV Vmean:          87.967 cm/s AV VTI:            0.181 m AV Peak Grad:      7.2 mmHg AV Mean Grad:      3.7 mmHg LVOT Vmax:         75.10 cm/s LVOT Vmean:        53.300 cm/s LVOT VTI:          0.145 m LVOT/AV VTI ratio: 0.80  AORTA Ao Root diam: 3.00 cm MITRAL VALVE                TRICUSPID VALVE MV Area  (PHT): 5.97 cm     TR Peak grad:   37.0 mmHg MV Decel Time: 127 msec     TR Vmax:        304.00 cm/s MV E velocity: 102.00 cm/s                             SHUNTS                             Systemic VTI:  0.14 m                             Systemic Diam: 2.00 cm Ida Rogue MD Electronically signed by Ida Rogue MD Signature Date/Time: 10/13/2019/2:34:36 PM    Final         Scheduled Meds: . aspirin EC  81 mg Oral QODAY  . conjugated estrogens  1 Applicatorful Vaginal Daily  . enoxaparin (LOVENOX) injection  30 mg Subcutaneous Q24H  . methimazole  5 mg Oral Daily  . metoprolol tartrate  25 mg Oral BID  . metoprolol tartrate  25 mg Oral NOW  . mirtazapine  15 mg Oral QHS  . pantoprazole  40 mg Oral Daily   Continuous Infusions: . sodium chloride    . cefTRIAXone (ROCEPHIN)  IV Stopped (10/14/19 0523)     LOS: 2 days    Time spent: 25 minutes    Sidney Ace, MD [facility] Pager [phone]  If 7PM-7AM, please contact night-coverage 10/14/2019, 10:50 AM

## 2019-10-14 NOTE — Evaluation (Signed)
Physical Therapy Evaluation Patient Details Name: Kristin Rowe MRN: [id number] DOB: 22-Jan-1926 Today's Date: 10/14/2019   History of Present Illness  86 yo female with onset of sepsis was found to have pyelonephritis, had gallstones vs sludge, and a-fib.  Also noted to have UTI with abd pain. PMHx:  HTN, Cervical CA, atherosclerosis, COPD, cardiomegaly, PVD, carotid stenosis    Clinical Impression  Pt was seen for PT evaluation of her changes in mobility along with progression of gait.  Pt is walking only two short trips before she fatigues and becomes SOB.  Follow acutely to see if she is able to get past the fatigue, the increased pulses (143) and manage to walk with RW with safer use.    Follow Up Recommendations SNF    Equipment Recommendations  Rolling walker with 5" wheels    Recommendations for Other Services       Precautions / Restrictions Precautions Precautions: Fall Restrictions Weight Bearing Restrictions: No      Mobility  Bed Mobility Overal bed mobility: Needs Assistance Bed Mobility: Supine to Sit;Sit to Supine     Supine to sit: Min assist Sit to supine: Min assist   General bed mobility comments: pt is mostly able to assist to move on the bed  Transfers Overall transfer level: Needs assistance Equipment used: Rolling walker (2 wheeled) Transfers: Sit to/from Stand Sit to Stand: Min guard         General transfer comment: cues to scoot EOB first  Ambulation/Gait   Gait Distance (Feet): 60 Feet Assistive device: Rolling walker (2 wheeled) Gait Pattern/deviations: Step-to pattern;Decreased stride length;Drifts right/left Gait velocity: reduced Gait velocity interpretation: <1.31 ft/sec, indicative of household ambulator General Gait Details: variable gait speed, pt is mildly unsteady and has difficulty getting walker to roll due to UE weakness  Stairs            Wheelchair Mobility    Modified Rankin (Stroke Patients Only)        Balance Overall balance assessment: Needs assistance Sitting-balance support: Feet supported Sitting balance-Leahy Scale: Fair     Standing balance support: Bilateral upper extremity supported Standing balance-Leahy Scale: Fair                               Pertinent Vitals/Pain Pain Assessment: No/denies pain    Home Living Family/patient expects to be discharged to:: Private residence Living Arrangements: Alone Available Help at Discharge: Family Type of Home: House Home Access: Stairs to enter Entrance Stairs-Rails: Psychiatric nurse of Steps: 4 Home Layout: One level Home Equipment: Cane - single point;Walker - 2 wheels;Grab bars - tub/shower      Prior Function Level of Independence: Needs assistance   Gait / Transfers Assistance Needed: SPC at times, has walker, has not fallen per pt  ADL's / Homemaking Assistance Needed: Pt indep with bathing, dressing, toileting, med mgt; dtrs assist with meals, transportation, and housekeeping tasks. Pt was mowing her own lawn until just a year or two ago.        Hand Dominance   Dominant Hand: Right    Extremity/Trunk Assessment   Upper Extremity Assessment Upper Extremity Assessment: Defer to OT evaluation    Lower Extremity Assessment Lower Extremity Assessment: Generalized weakness    Cervical / Trunk Assessment Cervical / Trunk Assessment: Normal  Communication   Communication: HOH  Cognition Arousal/Alertness: Awake/alert Behavior During Therapy: WFL for tasks assessed/performed Overall Cognitive Status: Difficult to  assess                                 General Comments: cognitive changes      General Comments      Exercises Other Exercises Other Exercises: strength on RLE and L LE indicate LLE is 3+ to 4- and RLE is [facility]   Assessment/Plan    PT Assessment Patient needs continued PT services  PT Problem List Decreased strength;Decreased range of  motion;Decreased activity tolerance;Decreased balance;Decreased mobility;Decreased coordination;Decreased cognition;Decreased knowledge of use of DME;Decreased safety awareness       PT Treatment Interventions DME instruction;Gait training;Stair training;Functional mobility training;Therapeutic activities;Therapeutic exercise;Balance training;Neuromuscular re-education;Patient/family education    PT Goals (Current goals can be found in the Care Plan section)  Acute Rehab PT Goals Patient Stated Goal: go home PT Goal Formulation: With patient Time For Goal Achievement: 10/14/19 Potential to Achieve Goals: Good    Frequency Min 2X/week   Barriers to discharge Decreased caregiver support      Co-evaluation               AM-PAC PT "6 Clicks" Mobility  Outcome Measure Help needed turning from your back to your side while in a flat bed without using bedrails?: None Help needed moving from lying on your back to sitting on the side of a flat bed without using bedrails?: A Little Help needed moving to and from a bed to a chair (including a wheelchair)?: A Little Help needed standing up from a chair using your arms (e.g., wheelchair or bedside chair)?: A Little Help needed to walk in hospital room?: A Little Help needed climbing 3-5 steps with a railing? : A Lot 6 Click Score: 18    End of Session Equipment Utilized During Treatment: Gait belt Activity Tolerance: Patient tolerated treatment well;Patient limited by fatigue;Treatment limited secondary to medical complications (Comment) Patient left: in bed;with call bell/phone within reach;with bed alarm set Nurse Communication: Mobility status PT Visit Diagnosis: Unsteadiness on feet (R26.81);Muscle weakness (generalized) (M62.81);Difficulty in walking, not elsewhere classified (R26.2)    Time: 1410-1449 PT Time Calculation (min) (ACUTE ONLY): 39 min   Charges:   PT Evaluation $PT Eval Moderate Complexity: 1 Mod PT  Treatments $Gait Training: 8-22 mins       Ramond Dial 10/14/2019, 10:22 PM  Mee Hives, PT MS Acute Rehab Dept. Number: [place] [facility] [place]

## 2019-10-14 NOTE — Evaluation (Signed)
Occupational Therapy Evaluation Patient Details Name: Kristin Rowe MRN: [id number] DOB: May 21, 1926 Today's Date: 10/14/2019    History of Present Illness 86 y.o. female with medical history significant for PVD, carotid stenosis s/p right endarterectomy, history of cervical cancer s/p radiation in 2011 and hypertension who presents with concerns of weakness and decreased p.o. intake.   Clinical Impression   Pt was seen for OT evaluation this date. Dtr present for session. Prior to hospital admission and until about a week ago, pt was independent with basic ADL and generally with mobility (although endorses multiple falls and furniture/wall walking). Pt lives by herself but one dtr lives next door and is over daily and another dtr comes a couple times per week from out of town to visit/assist. Pt/dtr endorse recent significant weight loss, decreased appetite, and limited drink intake. Pt HOH which made it difficult to determine cognitive deficits, however, alert and oriented x3 ("the hospital name has changed recently") and follows commands with occasional cues, more likely due to [facility]. Questionable awareness of balance/strength/safety deficits and required instruction and additional cues/encouragement to utilize RW and why it was a safer option than furniture/wall walking for pt's safety. Pt able to perform bed mobility with supervision, CGA for ADL tranfers with RW with occasional VC for RW mgt after initial instruction. Able to doff/don sock seated EOB without difficulty aside from mild dizziness that resolved quickly after sitting upright again. Currently pt demonstrates impairments as described below (See OT problem list) which functionally limit her ability to perform ADL/self-care tasks at baseline independence and increase her risk of falling. Pt currently requires supervision to CGA for ADL and ADL mobility with RW. Pt/dtr educated in home/routines modifications to maximize safety, minimize falls  risk, risk of dehydration and UTIs. Pt would benefit from skilled OT services to address noted impairments and functional limitations (see below for any additional details) in order to maximize safety and independence while minimizing falls risk and caregiver burden. Upon hospital discharge, recommend [place] and initial 24/7 supervision assist to maximize pt safety and return to functional independence during meaningful occupations of daily life.     Follow Up Recommendations  Home health OT;Supervision/Assistance - 24 hour    Equipment Recommendations  None recommended by OT    Recommendations for Other Services       Precautions / Restrictions Precautions Precautions: Fall Restrictions Weight Bearing Restrictions: No      Mobility Bed Mobility Overal bed mobility: Needs Assistance Bed Mobility: Supine to Sit;Sit to Supine     Supine to sit: Supervision;HOB elevated Sit to supine: Supervision      Transfers Overall transfer level: Needs assistance Equipment used: Rolling walker (2 wheeled) Transfers: Sit to/from Stand Sit to Stand: Min guard         General transfer comment: cues to scoot EOB first    Balance Overall balance assessment: Needs assistance Sitting-balance support: No upper extremity supported;Feet supported Sitting balance-Leahy Scale: Fair     Standing balance support: Bilateral upper extremity supported Standing balance-Leahy Scale: Fair                             ADL either performed or assessed with clinical judgement   ADL Overall ADL's : Needs assistance/impaired  General ADL Comments: Pt able to sit EOB and doff/don socks without assist but with supervision for safety. Did endorse mildly dizzy with effort but resolved once seated upright. CGA for ADL transfers with RW.     Vision Baseline Vision/History: Wears glasses Wears Glasses: Reading only Patient Visual Report: No  change from baseline       Perception     Praxis      Pertinent Vitals/Pain Pain Assessment: No/denies pain     Hand Dominance Right   Extremity/Trunk Assessment Upper Extremity Assessment Upper Extremity Assessment: Generalized weakness;Overall [facility] for tasks assessed   Lower Extremity Assessment Lower Extremity Assessment: Generalized weakness;Overall [facility] for tasks assessed   Cervical / Trunk Assessment Cervical / Trunk Assessment: Normal   Communication Communication Communication: HOH   Cognition Arousal/Alertness: Awake/alert Behavior During Therapy: WFL for tasks assessed/performed Overall Cognitive Status: Difficult to assess                                 General Comments: alert and oriented x3, dtr present and denies cognitive deficits/at baseline; follows commands with cues; difficult to determine cognitive deficits 2/2 HOH   General Comments       Exercises Other Exercises Other Exercises: Pt/dtr educated in [place] mgt for ADL transfers/mobility, safety and falls prevention, UTI prevention, dehydration prevention, and home/routines modifications to maximize safety/indep with ADL tasks while minimizing risk of falls, injury, or increased caregiver burden.   Shoulder Instructions      Home Living Family/patient expects to be discharged to:: Private residence Living Arrangements: Alone Available Help at Discharge: Family (dtr lives next door and stops by daily; other dtr (present for eval) lives out of town) Type of Home: House Home Access: Stairs to enter Technical brewer of Steps: 4 Entrance Stairs-Rails: [place]: One level     Bathroom Shower/Tub: Teacher, early years/pre: [place] - single point;Walker - 2 wheels;Grab bars - tub/shower          Prior Functioning/Environment Level of Independence: Needs assistance  Gait / Transfers Assistance Needed: inconsistent use of SPC for  mobility, primarily outside home, has RW but does not use, endorses significant furniture walking/holding onto walls for stability ADL's / Homemaking Assistance Needed: Pt indep with bathing, dressing, toileting, med mgt; dtrs assist with meals, transportation, and housekeeping tasks. Pt was mowing her own lawn until just a year or two ago.   Comments: endorses approx 1 fall/mo        OT Problem List: Decreased strength;Cardiopulmonary status limiting activity;Decreased safety awareness;Decreased activity tolerance;Impaired balance (sitting and/or standing);Decreased knowledge of use of DME or AE      OT Treatment/Interventions: Self-care/ADL training;Therapeutic exercise;Therapeutic activities;Energy conservation;DME and/or AE instruction;Patient/family education;Balance training    OT Goals(Current goals can be found in the care plan section) Acute Rehab OT Goals Patient Stated Goal: go home OT Goal Formulation: With patient/family Time For Goal Achievement: 10/28/19 Potential to Achieve Goals: Good ADL Goals Pt Will Perform Lower Body Dressing: with supervision;sit to/from stand Pt Will Transfer to Toilet: with supervision;ambulating;regular height toilet (LRAD for amb) Additional ADL Goal #1: Pt will safely utilize LRAD for ADL mobility with supervision and Min VC for safety/technique.  OT Frequency: Min 2X/week   Barriers to D/C: Decreased caregiver support          Co-evaluation  AM-PAC OT "6 Clicks" Daily Activity     Outcome Measure Help from another person eating meals?: None Help from another person taking care of personal grooming?: A Little Help from another person toileting, which includes using toliet, bedpan, or urinal?: A Little Help from another person bathing (including washing, rinsing, drying)?: A Little Help from another person to put on and taking off regular upper body clothing?: None Help from another person to put on and taking off  regular lower body clothing?: A Little 6 Click Score: 20   End of Session Equipment Utilized During Treatment: Gait belt;Rolling walker Nurse Communication: Mobility status (dtr with question about next hemoglobin blood draw)  Activity Tolerance: Patient tolerated treatment well Patient left: in bed;with call bell/phone within reach;with bed alarm set;with family/visitor present  OT Visit Diagnosis: Other abnormalities of gait and mobility (R26.89);Repeated falls (R29.6);Muscle weakness (generalized) (M62.81)                Time: 6384-5364 OT Time Calculation (min): 53 min Charges:  OT General Charges $OT Visit: 1 Visit OT Evaluation $OT Eval Moderate Complexity: 1 Mod OT Treatments $Self Care/Home Management : 38-52 mins  Jeni Salles, MPH, MS, OTR/L ascom [phone] 10/14/19, 1:40 PM

## 2019-10-15 LAB — CBC WITH DIFFERENTIAL/PLATELET
Abs Immature Granulocytes: 0.05 10*3/uL (ref 0.00–0.07)
Basophils Absolute: 0 10*3/uL (ref 0.0–0.1)
Basophils Relative: 0 %
Eosinophils Absolute: 0.1 10*3/uL (ref 0.0–0.5)
Eosinophils Relative: 2 %
HCT: 31.9 % — ABNORMAL LOW (ref 36.0–46.0)
Hemoglobin: 9.9 g/dL — ABNORMAL LOW (ref 12.0–15.0)
Immature Granulocytes: 1 %
Lymphocytes Relative: 13 %
Lymphs Abs: 0.8 10*3/uL (ref 0.7–4.0)
MCH: 28.1 pg (ref 26.0–34.0)
MCHC: 31 g/dL (ref 30.0–36.0)
MCV: 90.6 fL (ref 80.0–100.0)
Monocytes Absolute: 0.8 10*3/uL (ref 0.1–1.0)
Monocytes Relative: 12 %
Neutro Abs: 4.8 10*3/uL (ref 1.7–7.7)
Neutrophils Relative %: 72 %
Platelets: 79 10*3/uL — ABNORMAL LOW (ref 150–400)
RBC: 3.52 MIL/uL — ABNORMAL LOW (ref 3.87–5.11)
RDW: 14.6 % (ref 11.5–15.5)
WBC: 6.5 10*3/uL (ref 4.0–10.5)
nRBC: 0 % (ref 0.0–0.2)

## 2019-10-15 LAB — BASIC METABOLIC PANEL
Anion gap: 9 (ref 5–15)
BUN: 22 mg/dL (ref 8–23)
CO2: 27 mmol/L (ref 22–32)
Calcium: 7.9 mg/dL — ABNORMAL LOW (ref 8.9–10.3)
Chloride: 100 mmol/L (ref 98–111)
Creatinine, Ser: 1.05 mg/dL — ABNORMAL HIGH (ref 0.44–1.00)
GFR calc Af Amer: 54 mL/min — ABNORMAL LOW (ref >60)
GFR calc non Af Amer: 46 mL/min — ABNORMAL LOW (ref >60)
Glucose, Bld: 99 mg/dL (ref 70–99)
Potassium: 3.2 mmol/L — ABNORMAL LOW (ref 3.5–5.1)
Sodium: 136 mmol/L (ref 135–145)

## 2019-10-15 LAB — MAGNESIUM: Magnesium: 1.8 mg/dL (ref 1.7–2.4)

## 2019-10-15 MED ORDER — METOPROLOL TARTRATE 50 MG PO TABS
50.0000 mg | ORAL_TABLET | Freq: Two times a day (BID) | ORAL | Status: DC
  Administered 2019-10-15 (×2): 50 mg via ORAL
  Filled 2019-10-15 (×2): qty 1

## 2019-10-15 NOTE — TOC Initial Note (Signed)
Transition of Care [facility]) - Initial/Assessment Note    Patient Details  Name: Kristin Rowe MRN: [id number] Date of Birth: 21-Jul-1926  [facility] CM/SW Contact:    Boris Sharper, LCSW Phone Number: 10/15/2019, 3:37 PM  Clinical Narrative:                 CSW spoke to pt and family about discharge plans and PT recommendations. CSW explained that PT recommended SNF for rehab and they were all agreeable. Pt lived home alone and was mostly independent with toileting, bathing and feeding before hospitalization, pt's family would check on her daily. CSW explained the process of going to rehab to the family and their preference is for the pt to go to [facility]. CSW completed FL2 and PASRR and faxed to surrounding facilities.   Expected Discharge Plan: Skilled Nursing Facility Barriers to Discharge: Continued Medical Work up   Patient Goals and CMS Choice Patient states their goals for this hospitalization and ongoing recovery are:: to gain strength to live alone again CMS Medicare.gov Compare Post Acute Care list provided to:: Patient Represenative (must comment) Choice offered to / list presented to : Adult Children  Expected Discharge Plan and Services Expected Discharge Plan: [place] arrangements for the past 2 months: Single Family Home                                      Prior Living Arrangements/Services Living arrangements for the past 2 months: Single Family Home Lives with:: Self Patient language and need for interpreter reviewed:: Yes        Need for Family Participation in Patient Care: Yes (Comment) Care giver support system in place?: Yes (comment) (adult children)   Criminal Activity/Legal Involvement Pertinent to Current Situation/Hospitalization: No - Comment as needed  Activities of Daily Living Home Assistive Devices/Equipment: Cane (specify quad or straight), Hearing aid, Eyeglasses ADL Screening  (condition at time of admission) Patient's cognitive ability adequate to safely complete daily activities?: Yes Is the patient deaf or have difficulty hearing?: Yes Does the patient have difficulty seeing, even when wearing glasses/contacts?: Yes Does the patient have difficulty concentrating, remembering, or making decisions?: No Patient able to express need for assistance with ADLs?: Yes Does the patient have difficulty dressing or bathing?: No Independently performs ADLs?: No Communication: Independent Dressing (OT): Independent Grooming: Needs assistance Is this a change from baseline?: Change from baseline, expected to last >3 days Feeding: Independent Bathing: Needs assistance Is this a change from baseline?: Change from baseline, expected to last >3 days Toileting: Needs assistance Is this a change from baseline?: Change from baseline, expected to last >3days In/Out Bed: Needs assistance Is this a change from baseline?: Change from baseline, expected to last >3 days Walks in Home: Needs assistance Is this a change from baseline?: Change from baseline, expected to last >3 days Does the patient have difficulty walking or climbing stairs?: Yes Weakness of Legs: Both Weakness of Arms/Hands: None  Permission Sought/Granted Permission sought to share information with : Facility Art therapist granted to share information with : Yes, Verbal Permission Granted  Share Information with NAME: Butch Penny     Permission granted to share info w Relationship: daughter  Permission granted to share info w Contact Information: [phone]  Emotional Assessment Appearance:: Other (Comment Required (unable to assess) Attitude/Demeanor/Rapport: Unable to Assess Affect (  typically observed): Unable to Assess Orientation: : Oriented to  Time, Oriented to Place, Oriented to Self Alcohol / Substance Use: Not Applicable Psych Involvement: No (comment)  Admission diagnosis:   Pyelonephritis [N12] Sepsis ([place]) [A41.9] Atrial fibrillation, unspecified type [facility]) [I48.91] Patient Active Problem List   Diagnosis Date Noted   Sepsis ([place]) 10/12/2019   Acute pyelonephritis 10/12/2019   Atrial fibrillation with RVR ([place]) 10/12/2019   Elevated troponin 10/12/2019   Anemia 10/12/2019   Cervical cancer ([place]) 01/09/2018   Thyroid disease 01/09/2018   Venous stasis 01/09/2018   Melanoma in situ of eyelid, left ([place]) 03/02/2017   Essential hypertension 11/01/2015   Carotid stenosis, asymptomatic 10/17/2015   Atypical chest pain 09/30/2015   PVD (peripheral vascular disease) ([place]) 09/30/2015   Dizziness 09/25/2015   Osteoarthritis 10/20/2013   Osteoporosis 10/20/2013   Melanoma in situ ([place]) 04/20/2007   PCP:  [name], MD Pharmacy:   [id number] - [place], [place] - [address] [place] [address] Phone: [phone] Fax: [phone]     Social Determinants of Health (SDOH) Interventions    Readmission Risk Interventions No flowsheet data found.

## 2019-10-15 NOTE — Progress Notes (Signed)
PROGRESS NOTE    Kristin Rowe  DZH:[id number] DOB: 16-Dec-1926 DOA: 10/12/2019 PCP: [name], MD   Brief Narrative:  HPI: Kristin Rowe is a 86 y.o. female with medical history significant for PVD, carotid stenosis s/p right endarterectomy, history of cervical cancer s/p radiation in 2011 and hypertension who presents with concerns of weakness and decreased p.o. intake.  Daughter at bedside provides most of the history as patient has hearing difficulties.  Patient lives alone but daughter lives next door to her.  For the past 4 days has not been wanting to eat or drink.  Has been complaining of nausea but denies any vomiting.  She is also been complaining of lower abdominal pain and today had several episodes of diarrhea.  Daughter has been checking her vital signs at home and noted that in the past couple days her heart rate has dropped down to the 40s but today it was up to 120s and she was very tremulous and weak so daughter decided to bring her in.  Patient denies any chest pain or shortness of breath.  Denies any fever.  I evaluated the patient and spoke with the patient's daughter at bedside.  Patient is frail and has had recurrent UTIs several over the past year.  That in combination with new onset atrial fibrillation prompted me to consult palliative care.  Appreciate assistance from palliative care NP.  Patient now DNR status.  Discussed about anticoagulation.  Will defer at this time.  Patient's appetite slowly picking up.  No fevers over interval.  No urine culture was sent from initial UA.  This has been ordered as an add-on.  Repeat UA done demonstrating evidence of persistent infection.  Complete microscopic ordered.     Assessment & Plan:   Principal Problem:   Sepsis ([place]) Active Problems:   PVD (peripheral vascular disease) ([place])   Acute pyelonephritis   Atrial fibrillation with RVR (HCC)   Elevated troponin   Anemia  Sepsis secondary to  pyelonephritis Continue IV Rocephin Has received 500 IV normal saline bolus in the ED Patient's p.o. intake is picking up.  Hold maintenance fluids Repeat UA ordered as no reflex culture was done on the first urinalysis Continues to demonstrate signs of infection Repeat urine culture pending  Newly diagnosed atrial fibrillation Patient noted to be in atrial fibrillation with RVR with EMS however this is now rate controlled in the ED.  Suspect could possibly due to infection. Patient is poor candidate for anticoagulation CHA2DS2-VASc is 5 Had a lengthy discussion with the patient and the daughter at bedside Do not recommend anticoagulation at this time given fall risk and high likelihood of a life-threatening bleed Check echocardiogram-normal EF.  Diastolic function indeterminate.  No WMA Recommend outpatient follow-up with cardiology, Dr. Ubaldo Glassing Plan: Continue metoprolol, titrate dose up to 50 mg twice daily.  Home dose 100 mg twice daily  Borderline soft blood pressure with history of hypertension Hold amlodipine and lisinopril Continue metoprolol, 50 mg twice daily.  Uptitrate as necessary for heart rate and blood pressure control  Elevated troponin 36 --> 30 likely demand ischemia from atrial fibrillation  Anemia Check iron panel, vitamin B12 and folate-within normal limits Patient does get vitamin B12 injections outpatient  PVD with history of carotid endarterectomy Continue aspirin   DVT prophylaxis: Lovenox Code Status: DNR Family Communication: Daughter at bedside, 10/15/2019 disposition Plan: Status is: Inpatient  Remains inpatient appropriate because:Inpatient level of care appropriate due to severity of illness   Dispo:  The patient is from: Home              Anticipated d/c is to: SNF              Anticipated d/c date is: 2 days              Patient currently is not medically stable to d/c.   Continues demonstrate symptoms secondary to acute pyelonephritis.   Repeat urine culture pending.  Case management involved for SNF disposition.  Anticipate 2 additional days.   Consultants:   Palliative care  Procedures:   None  Antimicrobials:   Rocephin   Subjective: Seen and examined.  Daughter at bedside.  P.o. intake is slowly improving.  Continues to endorse bilateral flank pain Objective: Vitals:   10/14/19 0904 10/14/19 1748 10/14/19 2341 10/15/19 0903  BP:  120/61 117/68 (!) 129/58  Pulse: (!) 103 60 100 (!) 131  Resp:  18 18   Temp:  98.1 F (36.7 C) 99.2 F (37.3 C) 98.4 F (36.9 C)  TempSrc:  Oral Oral Oral  SpO2:  99% 94% 95%  Weight:      Height:        Intake/Output Summary (Last 24 hours) at 10/15/2019 1013 Last data filed at 10/15/2019 0415 Gross per 24 hour  Intake --  Output 200 ml  Net -200 ml   Filed Weights   10/12/19 1556  Weight: 54.4 kg    Examination:  General: No apparent distress, patient appears well HEENT: Normocephalic, atraumatic Neck, supple, trachea midline, no tenderness Heart: Tachycardic, irregular rhythm, no murmurs Lungs: Clear to auscultation bilaterally, no adventitious sounds, normal work of breathing Abdomen: Soft, nondistended, tender to palpation right left flank, positive bowel sounds Extremities: Normal, atraumatic, no clubbing or cyanosis, normal muscle tone Skin: No rashes or lesions, normal color Neurologic: Cranial nerves grossly intact, sensation intact, alert and oriented x3 Psychiatric: Normal affect    Data Reviewed: I have personally reviewed following labs and imaging studies  CBC: Recent Labs  Lab 10/12/19 1612 10/13/19 0159  WBC 12.2* 11.4*  NEUTROABS 10.3*  --   HGB 10.3* 8.7*  HCT 32.3* 27.9*  MCV 90.0 90.3  PLT 91* 89*   Basic Metabolic Panel: Recent Labs  Lab 10/12/19 1612 10/13/19 0159 10/15/19 0749  NA 130* 134* 136  K 3.8 3.4* 3.2*  CL 94* 100 100  CO2 25 28 27   GLUCOSE 135* 120* 99  BUN 38* 30* 22  CREATININE 1.23* 1.05* 1.05*   CALCIUM 8.0* 7.6* 7.9*  MG  --   --  1.8   GFR: Estimated Creatinine Clearance: 30 mL/min (A) (by C-G formula based on SCr of 1.05 mg/dL (H)). Liver Function Tests: Recent Labs  Lab 10/12/19 1612  AST 46*  ALT 20  ALKPHOS 71  BILITOT 1.3*  PROT 6.8  ALBUMIN 2.9*   Recent Labs  Lab 10/12/19 1612  LIPASE 30   No results for input(s): AMMONIA in the last 168 hours. Coagulation Profile: No results for input(s): INR, PROTIME in the last 168 hours. Cardiac Enzymes: No results for input(s): CKTOTAL, CKMB, CKMBINDEX, TROPONINI in the last 168 hours. BNP (last 3 results) No results for input(s): PROBNP in the last 8760 hours. HbA1C: No results for input(s): HGBA1C in the last 72 hours. CBG: No results for input(s): GLUCAP in the last 168 hours. Lipid Profile: No results for input(s): CHOL, HDL, LDLCALC, TRIG, CHOLHDL, LDLDIRECT in the last 72 hours. Thyroid Function Tests: Recent Labs  10/13/19 0159  TSH 0.876   Anemia Panel: Recent Labs    10/13/19 0159  VITAMINB12 1,420*  FOLATE 8.7  TIBC 148*  IRON 9*   Sepsis Labs: Recent Labs  Lab 10/12/19 1612 10/12/19 1848  LATICACIDVEN 1.7 1.3    Recent Results (from the past 240 hour(s))  Respiratory Panel by RT PCR (Flu A&B, Covid) - Nasopharyngeal Swab     Status: None   Collection Time: 10/12/19  4:12 PM   Specimen: Nasopharyngeal Swab  Result Value Ref Range Status   SARS Coronavirus 2 by RT PCR NEGATIVE NEGATIVE Final    Comment: (NOTE) SARS-CoV-2 target nucleic acids are NOT DETECTED.  The SARS-CoV-2 RNA is generally detectable in upper respiratoy specimens during the acute phase of infection. The lowest concentration of SARS-CoV-2 viral copies this assay can detect is 131 copies/mL. A negative result does not preclude SARS-Cov-2 infection and should not be used as the sole basis for treatment or other patient management decisions. A negative result may occur with  improper specimen  collection/handling, submission of specimen other than nasopharyngeal swab, presence of viral mutation(s) within the areas targeted by this assay, and inadequate number of viral copies (<131 copies/mL). A negative result must be combined with clinical observations, patient history, and epidemiological information. The expected result is Negative.  Fact Sheet for Patients:  PinkCheek.be  Fact Sheet for Healthcare Providers:  GravelBags.it  This test is no t yet approved or cleared by the [place] FDA and  has been authorized for detection and/or diagnosis of SARS-CoV-2 by FDA under an Emergency Use Authorization (EUA). This EUA will remain  in effect (meaning this test can be used) for the duration of the COVID-19 declaration under Section 564(b)(1) of the Act, 21 U.S.C. section 360bbb-3(b)(1), unless the authorization is terminated or revoked sooner.     Influenza A by PCR NEGATIVE NEGATIVE Final   Influenza B by PCR NEGATIVE NEGATIVE Final    Comment: (NOTE) The Xpert Xpress SARS-CoV-2/FLU/RSV assay is intended as an aid in  the diagnosis of influenza from Nasopharyngeal swab specimens and  should not be used as a sole basis for treatment. Nasal washings and  aspirates are unacceptable for Xpert Xpress SARS-CoV-2/FLU/RSV  testing.  Fact Sheet for Patients: PinkCheek.be  Fact Sheet for Healthcare Providers: GravelBags.it  This test is not yet approved or cleared by the [place] FDA and  has been authorized for detection and/or diagnosis of SARS-CoV-2 by  FDA under an Emergency Use Authorization (EUA). This EUA will remain  in effect (meaning this test can be used) for the duration of the  Covid-19 declaration under Section 564(b)(1) of the Act, 21  U.S.C. section 360bbb-3(b)(1), unless the authorization is  terminated or revoked. Performed at [facility], [address] [place],  [address]          Radiology Studies: ECHOCARDIOGRAM COMPLETE  Result Date: 10/13/2019    ECHOCARDIOGRAM REPORT   Patient Name:   JANILLE DRAUGHON Date of Exam: 10/13/2019 Medical Rec #:  [id number]       Height:       65.0 in Accession #:    [id number]      Weight:       120.0 lb Date of Birth:  14-Apr-1926       BSA:          1.592 m Patient Age:    82 years        BP:  102/55 mmHg Patient Gender: F               HR:           65 bpm. Exam Location:  [facility] Procedure: 2D Echo, Cardiac Doppler and Color Doppler Indications:     Atrial Fibrillation 427.31  History:         Patient has no prior history of Echocardiogram examinations.                  COPD, Signs/Symptoms:Murmur; Risk Factors:Hypertension.  Sonographer:     Sherrie Sport RDCS (AE) Referring Phys:  [id number] Kristin Rowe Diagnosing Phys: Ida Rogue MD IMPRESSIONS  1. Left ventricular ejection fraction, by estimation, is 55 %. The left ventricle has normal function. The left ventricle has no regional wall motion abnormalities. Left ventricular diastolic parameters are indeterminate.  2. Right ventricular systolic function is normal. The right ventricular size is mildly enlarged. There is moderately elevated pulmonary artery systolic pressure. The estimated right ventricular systolic pressure is 57.8 mmHg.  3. Left atrial size was moderately dilated.  4. Moderate mitral valve regurgitation.  5. Tricuspid valve regurgitation is moderate.  6. Aortic valve regurgitation is mild.  7. The inferior vena cava is normal in size with <50% respiratory variability, suggesting right atrial pressure of 8 mmHg.  8. Rhythm is atrial fibrillation FINDINGS  Left Ventricle: Left ventricular ejection fraction, by estimation, is 55 %. The left ventricle has normal function. The left ventricle has no regional wall motion abnormalities. The left ventricular internal cavity size was normal in size. There is no left  ventricular hypertrophy. Left ventricular diastolic parameters are indeterminate. Right Ventricle: The right ventricular size is mildly enlarged. No increase in right ventricular wall thickness. Right ventricular systolic function is normal. There is moderately elevated pulmonary artery systolic pressure. The tricuspid regurgitant velocity is 3.04 m/s, and with an assumed right atrial pressure of 10 mmHg, the estimated right ventricular systolic pressure is 46.9 mmHg. Left Atrium: Left atrial size was moderately dilated. Right Atrium: Right atrial size was normal in size. Pericardium: There is no evidence of pericardial effusion. Mitral Valve: The mitral valve is normal in structure. Moderate mitral valve regurgitation. No evidence of mitral valve stenosis. Tricuspid Valve: The tricuspid valve is normal in structure. Tricuspid valve regurgitation is moderate . No evidence of tricuspid stenosis. Aortic Valve: The aortic valve is normal in structure. Aortic valve regurgitation is mild. Mild aortic valve sclerosis is present, with no evidence of aortic valve stenosis. Aortic valve mean gradient measures 3.7 mmHg. Aortic valve peak gradient measures 7.2 mmHg. Aortic valve area, by VTI measures 2.51 cm. Pulmonic Valve: The pulmonic valve was normal in structure. Pulmonic valve regurgitation is not visualized. No evidence of pulmonic stenosis. Aorta: The aortic root is normal in size and structure. Venous: The inferior vena cava is normal in size with less than 50% respiratory variability, suggesting right atrial pressure of 8 mmHg. IAS/Shunts: No atrial level shunt detected by color flow Doppler.  LEFT VENTRICLE PLAX 2D LVIDd:         4.04 cm LVIDs:         2.53 cm LV PW:         1.21 cm LV IVS:        0.85 cm LVOT diam:     2.00 cm LV SV:         46 LV SV Index:   29 LVOT Area:     3.14 cm  RIGHT  VENTRICLE RV Basal diam:  3.80 cm RV S prime:     18.00 cm/s TAPSE (M-mode): 4.1 cm LEFT ATRIUM             Index        RIGHT ATRIUM           Index LA diam:        3.20 cm 2.01 cm/m  RA Area:     18.60 cm LA Vol (A2C):   81.9 ml 51.44 ml/m RA Volume:   51.40 ml  32.28 ml/m LA Vol (A4C):   54.3 ml 34.11 ml/m LA Biplane Vol: 66.8 ml 41.96 ml/m  AORTIC VALVE                   PULMONIC VALVE AV Area (Vmax):    1.76 cm    PV Vmax:        0.73 m/s AV Area (Vmean):   1.90 cm    PV Peak grad:   2.1 mmHg AV Area (VTI):     2.51 cm    RVOT Peak grad: 3 mmHg AV Vmax:           134.33 cm/s AV Vmean:          87.967 cm/s AV VTI:            0.181 m AV Peak Grad:      7.2 mmHg AV Mean Grad:      3.7 mmHg LVOT Vmax:         75.10 cm/s LVOT Vmean:        53.300 cm/s LVOT VTI:          0.145 m LVOT/AV VTI ratio: 0.80  AORTA Ao Root diam: 3.00 cm MITRAL VALVE                TRICUSPID VALVE MV Area (PHT): 5.97 cm     TR Peak grad:   37.0 mmHg MV Decel Time: 127 msec     TR Vmax:        304.00 cm/s MV E velocity: 102.00 cm/s                             SHUNTS                             Systemic VTI:  0.14 m                             Systemic Diam: 2.00 cm Ida Rogue MD Electronically signed by Ida Rogue MD Signature Date/Time: 10/13/2019/2:34:36 PM    Final         Scheduled Meds: . aspirin EC  81 mg Oral QODAY  . [START ON 10/16/2019] conjugated estrogens  1 Applicatorful Vaginal Q M,W,F  . enoxaparin (LOVENOX) injection  30 mg Subcutaneous Q24H  . methimazole  5 mg Oral Daily  . metoprolol tartrate  50 mg Oral BID  . mirtazapine  15 mg Oral QHS  . pantoprazole  40 mg Oral Daily   Continuous Infusions: . sodium chloride    . cefTRIAXone (ROCEPHIN)  IV 2 g (10/14/19 2055)     LOS: 3 days    Time spent: 25 minutes    Sidney Ace, MD [facility] Pager [phone]  If 7PM-7AM, please contact night-coverage 10/15/2019, 10:13 AM

## 2019-10-15 NOTE — NC FL2 (Signed)
[place] LEVEL OF CARE SCREENING TOOL     IDENTIFICATION  Patient Name: Kristin Rowe Birthdate: 11/30/1926 Sex: female Admission Date (Current Location): 10/12/2019  [place] and [place] Number:  Engineering geologist and Address:  [facility], [address] [place], [place] [address]      Provider Number: [id number]  Attending Physician Name and Address:  Sidney Ace, MD  Relative Name and Phone Number:  Butch Penny [phone]    Current Level of Care: Hospital Recommended Level of Care: [place] Prior Approval Number:    Date Approved/Denied:   PASRR Number: [id number]  Discharge Plan: SNF    Current Diagnoses: Patient Active Problem List   Diagnosis Date Noted  . Sepsis ([place]) 10/12/2019  . Acute pyelonephritis 10/12/2019  . Atrial fibrillation with RVR ([place]) 10/12/2019  . Elevated troponin 10/12/2019  . Anemia 10/12/2019  . Cervical cancer ([place]) 01/09/2018  . Thyroid disease 01/09/2018  . Venous stasis 01/09/2018  . Melanoma in situ of eyelid, left ([place]) 03/02/2017  . Essential hypertension 11/01/2015  . Carotid stenosis, asymptomatic 10/17/2015  . Atypical chest pain 09/30/2015  . PVD (peripheral vascular disease) ([place]) 09/30/2015  . Dizziness 09/25/2015  . Osteoarthritis 10/20/2013  . Osteoporosis 10/20/2013  . Melanoma in situ ([place]) 04/20/2007    Orientation RESPIRATION BLADDER Height & Weight     Self, Time, Place  Normal Continent Weight: 120 lb (54.4 kg) Height:  5\' 5"  (165.1 cm)  BEHAVIORAL SYMPTOMS/MOOD NEUROLOGICAL BOWEL NUTRITION STATUS      Continent Diet  AMBULATORY STATUS COMMUNICATION OF NEEDS Skin   Extensive Assist Verbally Normal                       Personal Care Assistance Level of Assistance  Bathing, Feeding, Dressing Bathing Assistance: Limited assistance Feeding assistance: Limited assistance Dressing Assistance: Limited assistance     Functional  Limitations Info  Sight, Hearing, Speech Sight Info: Adequate Hearing Info: Adequate Speech Info: Adequate    SPECIAL CARE FACTORS FREQUENCY  PT (By licensed PT), OT (By licensed OT)     PT Frequency: 5x week OT Frequency: 5x week            Contractures Contractures Info: Not present    Additional Factors Info  Code Status, Allergies Code Status Info: DNR Allergies Info: Daypro, Morphine, Plaquenil, Ridaura           Current Medications (10/15/2019):  This is the current hospital active medication list Current Facility-Administered Medications  Medication Dose Route Frequency Provider Last Rate Last Admin  . 0.9 %  sodium chloride infusion   Intravenous PRN Ralene Muskrat B, MD      . aspirin EC tablet 81 mg  81 mg Oral QODAY Tu, Ching T, DO   81 mg at 10/14/19 0901  . cefTRIAXone (ROCEPHIN) 2 g in sodium chloride 0.9 % 100 mL IVPB  2 g Intravenous Q24H Tu, Ching T, DO 200 mL/hr at 10/14/19 2055 2 g at 10/14/19 2055  . [START ON 10/16/2019] conjugated estrogens (PREMARIN) vaginal cream 1 Applicatorful  1 Applicatorful Vaginal Q M,W,F Benn Moulder, RPH      . enoxaparin (LOVENOX) injection 30 mg  30 mg Subcutaneous Q24H Tu, Ching T, DO   30 mg at 10/14/19 2046  . labetalol (NORMODYNE) injection 10 mg  10 mg Intravenous Q4H PRN Sreenath, Sudheer B, MD      . methimazole (TAPAZOLE) tablet 5 mg  5  mg Oral Daily Tu, Ching T, DO   5 mg at 10/15/19 0905  . metoprolol tartrate (LOPRESSOR) tablet 50 mg  50 mg Oral BID Ralene Muskrat B, MD   50 mg at 10/15/19 0905  . mirtazapine (REMERON) tablet 15 mg  15 mg Oral QHS Tu, Ching T, DO   15 mg at 10/14/19 2045  . pantoprazole (PROTONIX) EC tablet 40 mg  40 mg Oral Daily Tu, Ching T, DO   40 mg at 10/15/19 0907  . polyvinyl alcohol (LIQUIFILM TEARS) 1.4 % ophthalmic solution 1 drop  1 drop Both Eyes QID PRN Sidney Ace, MD         Discharge Medications: Please see discharge summary for a list of discharge  medications.  Relevant Imaging Results:  Relevant Lab Results:   Additional Information SS: [id number]  Boris Sharper, LCSW

## 2019-10-16 ENCOUNTER — Inpatient Hospital Stay: Payer: Medicare Other

## 2019-10-16 LAB — URINE CULTURE: Culture: NO GROWTH

## 2019-10-16 MED ORDER — SALINE SPRAY 0.65 % NA SOLN
1.0000 | NASAL | Status: DC | PRN
  Administered 2019-10-16: 1 via NASAL
  Filled 2019-10-16: qty 44

## 2019-10-16 MED ORDER — METOPROLOL TARTRATE 50 MG PO TABS
100.0000 mg | ORAL_TABLET | Freq: Two times a day (BID) | ORAL | Status: DC
  Administered 2019-10-16 – 2019-10-17 (×3): 100 mg via ORAL
  Filled 2019-10-16 (×3): qty 2

## 2019-10-16 MED ORDER — FLUTICASONE PROPIONATE 50 MCG/ACT NA SUSP
1.0000 | Freq: Every day | NASAL | Status: DC
  Administered 2019-10-16 – 2019-10-17 (×2): 1 via NASAL
  Filled 2019-10-16: qty 16

## 2019-10-16 NOTE — Progress Notes (Signed)
Patient resting in bed. Had an uneventful day. Daughter is bedside. She has been up to commode with one person assist. Currently is resting in bed with no complaints. Bed in low position and call bell in reach.

## 2019-10-16 NOTE — Progress Notes (Signed)
PROGRESS NOTE    AYLIANA CASCIANO  ZCH:[id number] DOB: [birth year] DOA: 10/12/2019 PCP: [name], MD   Brief Narrative:  HPI: Kristin Rowe is a 86 y.o. female with medical history significant for PVD, carotid stenosis s/p right endarterectomy, history of cervical cancer s/p radiation in 2011 and hypertension who presents with concerns of weakness and decreased p.o. intake.  Daughter at bedside provides most of the history as patient has hearing difficulties.  Patient lives alone but daughter lives next door to her.  For the past 4 days has not been wanting to eat or drink.  Has been complaining of nausea but denies any vomiting.  She is also been complaining of lower abdominal pain and today had several episodes of diarrhea.  Daughter has been checking her vital signs at home and noted that in the past couple days her heart rate has dropped down to the 40s but today it was up to 120s and she was very tremulous and weak so daughter decided to bring her in.  Patient denies any chest pain or shortness of breath.  Denies any fever.  I evaluated the patient and spoke with the patient's daughter at bedside.  Patient is frail and has had recurrent UTIs several over the past year.  That in combination with new onset atrial fibrillation prompted me to consult palliative care.  Appreciate assistance from palliative care NP.  Patient now DNR status.  Discussed about anticoagulation.  Will defer at this time.  Patient's appetite slowly picking up.  No fevers over interval.  No urine culture was sent from initial UA.  This has been ordered as an add-on.  Repeat UA done demonstrating evidence of persistent infection.  Complete microscopic ordered.     Assessment & Plan:   Principal Problem:   Sepsis ([place]) Active Problems:   PVD (peripheral vascular disease) ([place])   Acute pyelonephritis   Atrial fibrillation with RVR ([facility])   Elevated troponin   Anemia  Sepsis secondary to  pyelonephritis Continue IV Rocephin Has received 500 IV normal saline bolus in the ED Patient's p.o. intake is picking up.  Hold maintenance fluids Repeat UA ordered as no reflex culture was done on the first urinalysis Continues to demonstrate signs of infection Repeat urine culture pending  Newly diagnosed atrial fibrillation Patient noted to be in atrial fibrillation with RVR with EMS however this is now rate controlled in the ED.  Suspect could possibly due to infection. Patient is poor candidate for anticoagulation CHA2DS2-VASc is 5 Had a lengthy discussion with the patient and the daughter at bedside Do not recommend anticoagulation at this time given fall risk and high likelihood of a life-threatening bleed Check echocardiogram-normal EF.  Diastolic function indeterminate.  No WMA Recommend outpatient follow-up with cardiology, Dr. Ubaldo Glassing Plan: Increase metoprolol to home dose of 100 mg twice daily Defer anticoagulation  Borderline soft blood pressure with history of hypertension Hold amlodipine and lisinopril Continue metoprolol, 100 mg twice daily.   Will suggest holding amlodipine and lisinopril on discharge  Elevated troponin 36 --> 30 likely demand ischemia from atrial fibrillation  Anemia Check iron panel, vitamin B12 and folate-within normal limits Patient does get vitamin B12 injections outpatient  PVD with history of carotid endarterectomy Continue aspirin   DVT prophylaxis: Lovenox Code Status: DNR Family Communication: Daughter at bedside, 10/16/2019 disposition Plan: Status is: Inpatient  Remains inpatient appropriate because:Inpatient level of care appropriate due to severity of illness   Dispo: The patient is from: Home  Anticipated d/c is to: SNF              Anticipated d/c date is: 1 day              Patient currently is not medically stable to d/c.   Repeat urine culture pending.  Titrating blood pressure medications to achieve  blood pressure and heart rate control.  Anticipate medical readiness for discharge to skilled nursing facility on 10/17/2019.  Consultants:   Palliative care  Procedures:   None  Antimicrobials:   Rocephin   Subjective: Seen and examined.  Daughter at bedside.  P.o. intake improving.  Still endorses some mild flank pain but improving over interval.  Objective: Vitals:   10/15/19 1612 10/15/19 2215 10/16/19 0021 10/16/19 0735  BP: 134/74 124/69 139/72 (!) 144/79  Pulse: 80 79 81 (!) 101  Resp: 18  20 17   Temp: 98 F (36.7 C)  98.1 F (36.7 C) 97.6 F (36.4 C)  TempSrc: Oral  Oral Oral  SpO2: 98%  95% 95%  Weight:      Height:        Intake/Output Summary (Last 24 hours) at 10/16/2019 1013 Last data filed at 10/16/2019 0959 Gross per 24 hour  Intake 180 ml  Output --  Net 180 ml   Filed Weights   10/12/19 1556  Weight: 54.4 kg    Examination:  General: No apparent distress, patient appears well HEENT: Normocephalic, atraumatic Neck, supple, trachea midline, no tenderness Heart: Tachycardic, irregular rhythm, no murmurs Lungs: Clear to auscultation bilaterally, no adventitious sounds, normal work of breathing Abdomen: Soft, nondistended, tender to palpation right left flank, positive bowel sounds Extremities: Normal, atraumatic, no clubbing or cyanosis, normal muscle tone Skin: No rashes or lesions, normal color Neurologic: Cranial nerves grossly intact, sensation intact, alert and oriented x2 Psychiatric: Normal affect     Data Reviewed: I have personally reviewed following labs and imaging studies  CBC: Recent Labs  Lab 10/12/19 1612 10/13/19 0159 10/15/19 0749  WBC 12.2* 11.4* 6.5  NEUTROABS 10.3*  --  4.8  HGB 10.3* 8.7* 9.9*  HCT 32.3* 27.9* 31.9*  MCV 90.0 90.3 90.6  PLT 91* 89* 79*   Basic Metabolic Panel: Recent Labs  Lab 10/12/19 1612 10/13/19 0159 10/15/19 0749  NA 130* 134* 136  K 3.8 3.4* 3.2*  CL 94* 100 100  CO2 25 28 27    GLUCOSE 135* 120* 99  BUN 38* 30* 22  CREATININE 1.23* 1.05* 1.05*  CALCIUM 8.0* 7.6* 7.9*  MG  --   --  1.8   GFR: Estimated Creatinine Clearance: 30 mL/min (A) (by C-G formula based on SCr of 1.05 mg/dL (H)). Liver Function Tests: Recent Labs  Lab 10/12/19 1612  AST 46*  ALT 20  ALKPHOS 71  BILITOT 1.3*  PROT 6.8  ALBUMIN 2.9*   Recent Labs  Lab 10/12/19 1612  LIPASE 30   No results for input(s): AMMONIA in the last 168 hours. Coagulation Profile: No results for input(s): INR, PROTIME in the last 168 hours. Cardiac Enzymes: No results for input(s): CKTOTAL, CKMB, CKMBINDEX, TROPONINI in the last 168 hours. BNP (last 3 results) No results for input(s): PROBNP in the last 8760 hours. HbA1C: No results for input(s): HGBA1C in the last 72 hours. CBG: No results for input(s): GLUCAP in the last 168 hours. Lipid Profile: No results for input(s): CHOL, HDL, LDLCALC, TRIG, CHOLHDL, LDLDIRECT in the last 72 hours. Thyroid Function Tests: No results for input(s): TSH, T4TOTAL, FREET4,  T3FREE, THYROIDAB in the last 72 hours. Anemia Panel: No results for input(s): VITAMINB12, FOLATE, FERRITIN, TIBC, IRON, RETICCTPCT in the last 72 hours. Sepsis Labs: Recent Labs  Lab 10/12/19 1612 10/12/19 1848  LATICACIDVEN 1.7 1.3    Recent Results (from the past 240 hour(s))  Respiratory Panel by RT PCR (Flu A&B, Covid) - Nasopharyngeal Swab     Status: None   Collection Time: 10/12/19  4:12 PM   Specimen: Nasopharyngeal Swab  Result Value Ref Range Status   SARS Coronavirus 2 by RT PCR NEGATIVE NEGATIVE Final    Comment: (NOTE) SARS-CoV-2 target nucleic acids are NOT DETECTED.  The SARS-CoV-2 RNA is generally detectable in upper respiratoy specimens during the acute phase of infection. The lowest concentration of SARS-CoV-2 viral copies this assay can detect is 131 copies/mL. A negative result does not preclude SARS-Cov-2 infection and should not be used as the sole basis for  treatment or other patient management decisions. A negative result may occur with  improper specimen collection/handling, submission of specimen other than nasopharyngeal swab, presence of viral mutation(s) within the areas targeted by this assay, and inadequate number of viral copies (<131 copies/mL). A negative result must be combined with clinical observations, patient history, and epidemiological information. The expected result is Negative.  Fact Sheet for Patients:  PinkCheek.be  Fact Sheet for Healthcare Providers:  GravelBags.it  This test is no t yet approved or cleared by the [place] FDA and  has been authorized for detection and/or diagnosis of SARS-CoV-2 by FDA under an Emergency Use Authorization (EUA). This EUA will remain  in effect (meaning this test can be used) for the duration of the COVID-19 declaration under Section 564(b)(1) of the Act, 21 U.S.C. section 360bbb-3(b)(1), unless the authorization is terminated or revoked sooner.     Influenza A by PCR NEGATIVE NEGATIVE Final   Influenza B by PCR NEGATIVE NEGATIVE Final    Comment: (NOTE) The Xpert Xpress SARS-CoV-2/FLU/RSV assay is intended as an aid in  the diagnosis of influenza from Nasopharyngeal swab specimens and  should not be used as a sole basis for treatment. Nasal washings and  aspirates are unacceptable for Xpert Xpress SARS-CoV-2/FLU/RSV  testing.  Fact Sheet for Patients: PinkCheek.be  Fact Sheet for Healthcare Providers: GravelBags.it  This test is not yet approved or cleared by the [place] FDA and  has been authorized for detection and/or diagnosis of SARS-CoV-2 by  FDA under an Emergency Use Authorization (EUA). This EUA will remain  in effect (meaning this test can be used) for the duration of the  Covid-19 declaration under Section 564(b)(1) of the Act, 21   U.S.C. section 360bbb-3(b)(1), unless the authorization is  terminated or revoked. Performed at [facility], [address], [place], [place] [address]          Radiology Studies: [facility] Chest [place] 1 View  Result Date: 10/16/2019 CLINICAL DATA:  Cough.  COPD. EXAM: PORTABLE CHEST 1 VIEW COMPARISON:  10/12/2019. FINDINGS: Mediastinum and hilar structures normal. Stable cardiomegaly. No pulmonary venous congestion. COPD. Stable mild bilateral interstitial prominence, most likely chronic. Active interstitial process including pneumonitis cannot be excluded. No pleural effusion or pneumothorax. Stable biapical pleural thickening consistent with scarring. Degenerative change thoracic spine. IMPRESSION: 1. COPD. Stable mild bilateral interstitial prominence, most likely chronic. Active interstitial process including pneumonitis cannot be excluded. 2.  Stable cardiomegaly.  No pulmonary venous congestion. Electronically Signed   By: Marcello Moores  Register   On: 10/16/2019 08:54  Scheduled Meds: . aspirin EC  81 mg Oral QODAY  . conjugated estrogens  1 Applicatorful Vaginal Q M,W,F  . enoxaparin (LOVENOX) injection  30 mg Subcutaneous Q24H  . fluticasone  1 spray Each Nare Daily  . methimazole  5 mg Oral Daily  . metoprolol tartrate  100 mg Oral BID  . mirtazapine  15 mg Oral QHS  . pantoprazole  40 mg Oral Daily   Continuous Infusions: . sodium chloride    . cefTRIAXone (ROCEPHIN)  IV Stopped (10/16/19 0508)     LOS: 4 days    Time spent: 25 minutes    Sidney Ace, MD [facility] Pager [phone]  If 7PM-7AM, please contact night-coverage 10/16/2019, 10:13 AM

## 2019-10-16 NOTE — TOC Progression Note (Signed)
Transition of Care [facility]) - Progression Note    Patient Details  Name: Kristin Rowe MRN: [id number] Date of Birth: January 05, 1927  Transition of Care [facility]) CM/SW Contact  Shelbie Ammons, RN Phone Number: 10/16/2019, 2:32 PM  Clinical Narrative:  RNCM met with patient and daughter at bedside to present bed offers. Both patient and daughter both are agreeable to [facility]. RNCM accepted bed in hub and started insurance auth through [employer] portal.    Expected Discharge Plan: [place] Barriers to Discharge: Continued Medical Work up  Expected Discharge Plan and Services Expected Discharge Plan: [place] arrangements for the past 2 months: Single Family Home                                       Social Determinants of Health (SDOH) Interventions    Readmission Risk Interventions No flowsheet data found.

## 2019-10-16 NOTE — Care Management Important Message (Signed)
Important Message  Patient Details  Name: Kristin Rowe MRN: [id number] Date of Birth: [birth year]   Medicare Important Message Given:  Yes     Dannette Barbara 10/16/2019, 11:17 AM

## 2019-10-17 LAB — BASIC METABOLIC PANEL
Anion gap: 8 (ref 5–15)
BUN: 16 mg/dL (ref 8–23)
CO2: 29 mmol/L (ref 22–32)
Calcium: 7.9 mg/dL — ABNORMAL LOW (ref 8.9–10.3)
Chloride: 101 mmol/L (ref 98–111)
Creatinine, Ser: 0.79 mg/dL (ref 0.44–1.00)
GFR calc Af Amer: >60 mL/min (ref >60)
GFR calc non Af Amer: >60 mL/min (ref >60)
Glucose, Bld: 105 mg/dL — ABNORMAL HIGH (ref 70–99)
Potassium: 3 mmol/L — ABNORMAL LOW (ref 3.5–5.1)
Sodium: 138 mmol/L (ref 135–145)

## 2019-10-17 LAB — CBC WITH DIFFERENTIAL/PLATELET
Abs Immature Granulocytes: 0.06 10*3/uL (ref 0.00–0.07)
Basophils Absolute: 0 10*3/uL (ref 0.0–0.1)
Basophils Relative: 0 %
Eosinophils Absolute: 0.1 10*3/uL (ref 0.0–0.5)
Eosinophils Relative: 2 %
HCT: 30.4 % — ABNORMAL LOW (ref 36.0–46.0)
Hemoglobin: 9.8 g/dL — ABNORMAL LOW (ref 12.0–15.0)
Immature Granulocytes: 1 %
Lymphocytes Relative: 10 %
Lymphs Abs: 0.6 10*3/uL — ABNORMAL LOW (ref 0.7–4.0)
MCH: 28.2 pg (ref 26.0–34.0)
MCHC: 32.2 g/dL (ref 30.0–36.0)
MCV: 87.6 fL (ref 80.0–100.0)
Monocytes Absolute: 0.8 10*3/uL (ref 0.1–1.0)
Monocytes Relative: 13 %
Neutro Abs: 4.5 10*3/uL (ref 1.7–7.7)
Neutrophils Relative %: 74 %
Platelets: 118 10*3/uL — ABNORMAL LOW (ref 150–400)
RBC: 3.47 MIL/uL — ABNORMAL LOW (ref 3.87–5.11)
RDW: 14.5 % (ref 11.5–15.5)
WBC: 6.1 10*3/uL (ref 4.0–10.5)
nRBC: 0 % (ref 0.0–0.2)

## 2019-10-17 LAB — RESPIRATORY PANEL BY RT PCR (FLU A&B, COVID)
Influenza A by PCR: NEGATIVE
Influenza B by PCR: NEGATIVE
SARS Coronavirus 2 by RT PCR: NEGATIVE

## 2019-10-17 LAB — MAGNESIUM: Magnesium: 1.5 mg/dL — ABNORMAL LOW (ref 1.7–2.4)

## 2019-10-17 MED ORDER — SALINE SPRAY 0.65 % NA SOLN: 1.0000 | NASAL | 0 refills | Status: AC | PRN

## 2019-10-17 MED ORDER — POTASSIUM CHLORIDE CRYS ER 20 MEQ PO TBCR
40.0000 meq | EXTENDED_RELEASE_TABLET | Freq: Once | ORAL | Status: AC
  Administered 2019-10-17: 40 meq via ORAL
  Filled 2019-10-17: qty 2

## 2019-10-17 MED ORDER — CEFDINIR 300 MG PO CAPS: 300.0000 mg | ORAL_CAPSULE | Freq: Two times a day (BID) | ORAL | 0 refills | Status: AC

## 2019-10-17 MED ORDER — MAGNESIUM SULFATE 2 GM/50ML IV SOLN
2.0000 g | Freq: Once | INTRAVENOUS | Status: AC
  Administered 2019-10-17: 2 g via INTRAVENOUS
  Filled 2019-10-17: qty 50

## 2019-10-17 MED ORDER — CEFDINIR 300 MG PO CAPS
300.0000 mg | ORAL_CAPSULE | Freq: Two times a day (BID) | ORAL | Status: DC
  Administered 2019-10-17: 300 mg via ORAL
  Filled 2019-10-17 (×2): qty 1

## 2019-10-17 NOTE — Progress Notes (Deleted)
OT Cancellation Note  Patient Details Name: SEIRRA KOS MRN: [id number] DOB: November 14, 1926   Cancelled Treatment:    Reason Eval/Treat Not Completed: Other (comment). Pt working with PT upon attempt. Will re-attempt at later date/time as pt is available.  Jeni Salles, MPH, MS, OTR/L ascom [phone] 10/17/19, 9:44 AM

## 2019-10-17 NOTE — TOC Transition Note (Signed)
Transition of Care [facility]) - CM/SW Discharge Note   Patient Details  Name: Kristin Rowe MRN: [id number] Date of Birth: October 24, 1926  [facility]) CM/SW Contact:  Shelbie Ammons, RN Phone Number: 10/17/2019, 3:01 PM   Clinical Narrative:   Patient will d/c to [facility] today. Patient and daughter are aware. Patient will go by medical transport. EMS forms completed and ambulance transport arranged.     Final next level of care: Skilled Nursing Facility Barriers to Discharge: Continued Medical Work up   Patient Goals and CMS Choice Patient states their goals for this hospitalization and ongoing recovery are:: to gain strength to live alone again CMS Medicare.gov Compare Post Acute Care list provided to:: Patient Represenative (must comment) Choice offered to / list presented to : Adult Children  Discharge Placement                       Discharge Plan and Services                                     Social Determinants of Health (SDOH) Interventions     Readmission Risk Interventions No flowsheet data found.

## 2019-10-17 NOTE — Discharge Summary (Signed)
Physician Discharge Summary  Kristin Rowe HBZ:[id number] DOB: 1926-04-22 DOA: 10/12/2019  PCP: [name], MD  Admit date: 10/12/2019 Discharge date: 10/17/2019  Admitted From: Home Disposition:  SNF  Recommendations for Outpatient Follow-up:  1. Follow up with PCP in 1-2 weeks 2. Follow up with cardiology within 2-3 weeks  Home Health:No Equipment/Devices:None Discharge Condition:Stable CODE STATUS:DNR Diet recommendation: Heart Healthy  Brief/Interim Summary: Kristin Rowe a 86 y.o.femalewith medical history significant forPVD, carotid stenosis s/p right endarterectomy, history of cervical cancer s/p radiation in 2011 and hypertension who presents with concerns of weakness and decreased p.o. intake.  Daughter at bedside provides most of the history as patient has hearing difficulties. Patient lives alone but daughter lives next door to her. For the past 4 days has not been wanting to eat or drink. Has been complaining of nausea but denies any vomiting. She is also been complaining of lower abdominal pain and today had several episodes of diarrhea. Daughter has been checking her vital signs at home and noted that in the past couple days her heart rate has dropped down to the 40s but today it was up to 120s and she was very tremulous and weak so daughter decided to bring her in. Patient denies any chest pain or shortness of breath. Denies any fever.  I evaluated the patient and spoke with the patient's daughter at bedside.  Patient is frail and has had recurrent UTIs several over the past year.  That in combination with new onset atrial fibrillation prompted me to consult palliative care.  Appreciate assistance from palliative care NP.  Patient now DNR status.  Discussed about anticoagulation.  Will defer at this time.  Patient's appetite slowly picking up.  No fevers over interval.  No urine culture was sent from initial UA.  This has been ordered as an add-on.   Repeat UA done demonstrating evidence of persistent infection.  Complete microscopic ordered.    Repeat urine culture reviewed.  No growth on final dilution.  Patient is stable for discharge at this time.  Will recommend that she hold her home lisinopril and amlodipine indefinitely.  Continue metoprolol at home dose of 100 mg twice daily.  Defer anticoagulation for now given patient's advanced age and overall frailty.  Transition antibiotics to p.o. cefdinir.  Will complete 10-day course.  Discharge Diagnoses:  Principal Problem:   Sepsis ([place]) Active Problems:   PVD (peripheral vascular disease) ([place])   Acute pyelonephritis   Atrial fibrillation with RVR ([facility])   Elevated troponin   Anemia   Sepsis secondary to pyelonephritis, resolved Continue IV Rocephin in house Has received 500 IV normal saline bolus in the ED Patient's p.o. intake improved Repeat urine culture ordered as first was contaminated No growth on repeat culture Transition to p.o. cefdinir Complete empiric 10-day course  Newly diagnosed atrial fibrillation Patient noted to be in atrial fibrillation with RVR with EMS however this is now rate controlled in the ED. Suspect could possibly due to infection. Patient is poor candidate for anticoagulation CHA2DS2-VASc is 5 Had a lengthy discussion with the patient and the daughter at bedside Do not recommend anticoagulation at this time given fall risk and high likelihood of a life-threatening bleed Check echocardiogram-normal EF.  Diastolic function indeterminate.  No WMA Recommend outpatient follow-up with cardiology, Dr. Ubaldo Glassing Plan: Increase metoprolol to home dose of 100 mg twice daily Defer anticoagulation Hold lisinopril amlodipine  Borderline soft blood pressure with history of hypertension Hold amlodipine and lisinopril.  Recommend hold  on discharge Continue metoprolol, 100 mg twice daily.     Elevated troponin 36 --> 30 likely demand ischemia from atrial  fibrillation  Anemia Check iron panel, vitamin B12 and folate-within normal limits Patient does get vitamin B12 injectionsoutpatient  PVD with history of carotidendarterectomy Continue aspirin  Frailty Palliative care evaluated and consulted on patient during hospital course Patient now DNR status after conversations with patient and daughter Recommendations: referral to outpatient palliative care.  Discharge Instructions   Allergies as of 10/17/2019      Reactions   Daypro [oxaprozin] Other (See Comments)   gi   Morphine And Related Nausea And Vomiting   Plaquenil [hydroxychloroquine Sulfate]    Pt denies   Ridaura [auranofin] Other (See Comments)   denies      Medication List    STOP taking these medications   amLODipine 2.5 MG tablet Commonly known as: NORVASC   lisinopril 20 MG tablet Commonly known as: ZESTRIL     TAKE these medications   ALPRAZolam 0.25 MG tablet Commonly known as: XANAX Take 0.25 mg by mouth 3 (three) times daily as needed for anxiety.   aspirin EC 81 MG tablet Take 81 mg every other day by mouth.   cefdinir 300 MG capsule Commonly known as: OMNICEF Take 1 capsule (300 mg total) by mouth every 12 (twelve) hours for 5 days.   Clobetasol Prop Emollient Base 0.05 % emollient cream Commonly known as: Clobetasol Propionate E Apply 1 application topically 2 (two) times daily.   COD LIVER OIL PO Take by mouth. Every other day   Lidocaine 2 % Gel Apply 1 Act topically as needed.   methimazole 5 MG tablet Commonly known as: TAPAZOLE Take 5 mg by mouth daily.   metoprolol tartrate 100 MG tablet Commonly known as: LOPRESSOR Take 100 mg by mouth 2 (two) times daily.   mirtazapine 15 MG tablet Commonly known as: REMERON Take 15 mg by mouth at bedtime.   pantoprazole 40 MG tablet Commonly known as: PROTONIX Take 40 mg by mouth daily.   Premarin vaginal cream Generic drug: conjugated estrogens Place 1 Applicatorful vaginally  daily. Apply 0.5mg  (pea-sized amount)  just inside the vaginal introitus with a finger-tip on  Monday, Wednesday and Friday nights.   sodium chloride 0.65 % Soln nasal spray Commonly known as: OCEAN Place 1 spray into both nostrils as needed for congestion.       Contact information for after-discharge care    [facility] .   Service: Skilled Nursing Contact information: [place] [phone]                 Allergies  Allergen Reactions  . Daypro [Oxaprozin] Other (See Comments)    gi  . Morphine And Related Nausea And Vomiting  . Plaquenil [Hydroxychloroquine Sulfate]     Pt denies  . Ridaura [Auranofin] Other (See Comments)    denies    Consultations:  None   Procedures/Studies: CT Head Wo Contrast  Result Date: 10/12/2019 CLINICAL DATA:  Confusion, dysuria, TIA symptoms EXAM: CT HEAD WITHOUT CONTRAST TECHNIQUE: Contiguous axial images were obtained from the base of the skull through the vertex without intravenous contrast. COMPARISON:  09/22/2016, 09/24/2015 FINDINGS: Brain: No acute infarct or hemorrhage. Lateral ventricles and midline structures are unremarkable. There are no acute extra-axial fluid collections. There is no mass effect. Vascular: No hyperdense vessel or unexpected calcification. Skull: Normal. Negative for fracture or focal  lesion. Sinuses/Orbits: No acute finding. Other: None. IMPRESSION: 1. No acute intracranial process. Electronically Signed   By: Randa Ngo M.D.   On: 10/12/2019 17:50   CT ABDOMEN PELVIS W CONTRAST  Result Date: 10/12/2019 CLINICAL DATA:  Acute abdominal pain, dysuria EXAM: CT ABDOMEN AND PELVIS WITH CONTRAST TECHNIQUE: Multidetector CT imaging of the abdomen and pelvis was performed using the standard protocol following bolus administration of intravenous contrast. CONTRAST:  61mL OMNIPAQUE IOHEXOL 300 MG/ML  SOLN COMPARISON:  09/06/2019  FINDINGS: Lower chest: No acute pleural or parenchymal lung disease. Hepatobiliary: Noncalcified gallstones versus gallbladder sludge identified. No evidence of acute cholecystitis. Liver is unremarkable. Pancreas: Unremarkable. No pancreatic ductal dilatation or surrounding inflammatory changes. Spleen: Normal in size without focal abnormality. Adrenals/Urinary Tract: Heterogeneous decreased attenuation throughout the right renal parenchyma consistent with diffuse right-sided pyelonephritis. No evidence of renal abscess. Hyperenhancement of the right ureteral mucosa consistent with infection. The left kidney enhances normally. No urinary tract calculi or obstruction. The bladder is mildly distended with no focal abnormality. Adrenals are normal. Stomach/Bowel: No bowel obstruction or ileus. Diverticulosis of the distal colon without diverticulitis. Normal retrocecal appendix. Vascular/Lymphatic: Aortic atherosclerosis. No enlarged abdominal or pelvic lymph nodes. Reproductive: Status post hysterectomy. No adnexal masses. Other: Trace pelvic free fluid, nonspecific. No free gas. No abdominal wall hernia. Musculoskeletal: No acute or destructive bony lesions. Reconstructed images demonstrate no additional findings. IMPRESSION: 1. Right-sided pyelonephritis.  No evidence of right renal abscess. 2. Noncalcified gallstones versus gallbladder sludge. No evidence of acute cholecystitis. 3. Trace pelvic free fluid, nonspecific. 4. Aortic Atherosclerosis (ICD10-I70.0). Electronically Signed   By: Randa Ngo M.D.   On: 10/12/2019 17:48   DG Chest Port 1 View  Result Date: 10/16/2019 CLINICAL DATA:  Cough.  COPD. EXAM: PORTABLE CHEST 1 VIEW COMPARISON:  10/12/2019. FINDINGS: Mediastinum and hilar structures normal. Stable cardiomegaly. No pulmonary venous congestion. COPD. Stable mild bilateral interstitial prominence, most likely chronic. Active interstitial process including pneumonitis cannot be excluded. No  pleural effusion or pneumothorax. Stable biapical pleural thickening consistent with scarring. Degenerative change thoracic spine. IMPRESSION: 1. COPD. Stable mild bilateral interstitial prominence, most likely chronic. Active interstitial process including pneumonitis cannot be excluded. 2.  Stable cardiomegaly.  No pulmonary venous congestion. Electronically Signed   By: Marcello Moores  Register   On: 10/16/2019 08:54   DG Chest Portable 1 View  Result Date: 10/12/2019 CLINICAL DATA:  Atrial fibrillation.  Weakness today. EXAM: PORTABLE CHEST 1 VIEW COMPARISON:  No recent exams.  Chest radiograph 11/20/2008 reviewed FINDINGS: Mild cardiomegaly. Normal mediastinal contours. Aortic atherosclerosis. The lungs are hyperinflated with peribronchial thickening. There is biapical pleuroparenchymal scarring. No confluent airspace disease. No significant pleural effusion. No pneumothorax. No acute osseous abnormalities are seen. IMPRESSION: 1. Mild cardiomegaly.  Aortic Atherosclerosis (ICD10-I70.0). 2. Hyperinflation with peribronchial thickening, imaging findings suggesting COPD. Possibility of superimposed edema is raised. Electronically Signed   By: Keith Rake M.D.   On: 10/12/2019 16:40   ECHOCARDIOGRAM COMPLETE  Result Date: 10/13/2019    ECHOCARDIOGRAM REPORT   Patient Name:   SHAKIAH WESTER Date of Exam: 10/13/2019 Medical Rec #:  [id number]       Height:       65.0 in Accession #:    [id number]      Weight:       120.0 lb Date of Birth:  1926-08-15       BSA:          1.592 m Patient Age:    48 years  BP:           102/55 mmHg Patient Gender: F               HR:           65 bpm. Exam Location:  [facility] Procedure: 2D Echo, Cardiac Doppler and Color Doppler Indications:     Atrial Fibrillation 427.31  History:         Patient has no prior history of Echocardiogram examinations.                  COPD, Signs/Symptoms:Murmur; Risk Factors:Hypertension.  Sonographer:     Sherrie Sport RDCS (AE) Referring Phys:   [id number] Angela Burke TU Diagnosing Phys: Ida Rogue MD IMPRESSIONS  1. Left ventricular ejection fraction, by estimation, is 55 %. The left ventricle has normal function. The left ventricle has no regional wall motion abnormalities. Left ventricular diastolic parameters are indeterminate.  2. Right ventricular systolic function is normal. The right ventricular size is mildly enlarged. There is moderately elevated pulmonary artery systolic pressure. The estimated right ventricular systolic pressure is 40.8 mmHg.  3. Left atrial size was moderately dilated.  4. Moderate mitral valve regurgitation.  5. Tricuspid valve regurgitation is moderate.  6. Aortic valve regurgitation is mild.  7. The inferior vena cava is normal in size with <50% respiratory variability, suggesting right atrial pressure of 8 mmHg.  8. Rhythm is atrial fibrillation FINDINGS  Left Ventricle: Left ventricular ejection fraction, by estimation, is 55 %. The left ventricle has normal function. The left ventricle has no regional wall motion abnormalities. The left ventricular internal cavity size was normal in size. There is no left ventricular hypertrophy. Left ventricular diastolic parameters are indeterminate. Right Ventricle: The right ventricular size is mildly enlarged. No increase in right ventricular wall thickness. Right ventricular systolic function is normal. There is moderately elevated pulmonary artery systolic pressure. The tricuspid regurgitant velocity is 3.04 m/s, and with an assumed right atrial pressure of 10 mmHg, the estimated right ventricular systolic pressure is 14.4 mmHg. Left Atrium: Left atrial size was moderately dilated. Right Atrium: Right atrial size was normal in size. Pericardium: There is no evidence of pericardial effusion. Mitral Valve: The mitral valve is normal in structure. Moderate mitral valve regurgitation. No evidence of mitral valve stenosis. Tricuspid Valve: The tricuspid valve is normal in structure.  Tricuspid valve regurgitation is moderate . No evidence of tricuspid stenosis. Aortic Valve: The aortic valve is normal in structure. Aortic valve regurgitation is mild. Mild aortic valve sclerosis is present, with no evidence of aortic valve stenosis. Aortic valve mean gradient measures 3.7 mmHg. Aortic valve peak gradient measures 7.2 mmHg. Aortic valve area, by VTI measures 2.51 cm. Pulmonic Valve: The pulmonic valve was normal in structure. Pulmonic valve regurgitation is not visualized. No evidence of pulmonic stenosis. Aorta: The aortic root is normal in size and structure. Venous: The inferior vena cava is normal in size with less than 50% respiratory variability, suggesting right atrial pressure of 8 mmHg. IAS/Shunts: No atrial level shunt detected by color flow Doppler.  LEFT VENTRICLE PLAX 2D LVIDd:         4.04 cm LVIDs:         2.53 cm LV PW:         1.21 cm LV IVS:        0.85 cm LVOT diam:     2.00 cm LV SV:         46 LV SV Index:  29 LVOT Area:     3.14 cm  RIGHT VENTRICLE RV Basal diam:  3.80 cm RV S prime:     18.00 cm/s TAPSE (M-mode): 4.1 cm LEFT ATRIUM             Index       RIGHT ATRIUM           Index LA diam:        3.20 cm 2.01 cm/m  RA Area:     18.60 cm LA Vol (A2C):   81.9 ml 51.44 ml/m RA Volume:   51.40 ml  32.28 ml/m LA Vol (A4C):   54.3 ml 34.11 ml/m LA Biplane Vol: 66.8 ml 41.96 ml/m  AORTIC VALVE                   PULMONIC VALVE AV Area (Vmax):    1.76 cm    PV Vmax:        0.73 m/s AV Area (Vmean):   1.90 cm    PV Peak grad:   2.1 mmHg AV Area (VTI):     2.51 cm    RVOT Peak grad: 3 mmHg AV Vmax:           134.33 cm/s AV Vmean:          87.967 cm/s AV VTI:            0.181 m AV Peak Grad:      7.2 mmHg AV Mean Grad:      3.7 mmHg LVOT Vmax:         75.10 cm/s LVOT Vmean:        53.300 cm/s LVOT VTI:          0.145 m LVOT/AV VTI ratio: 0.80  AORTA Ao Root diam: 3.00 cm MITRAL VALVE                TRICUSPID VALVE MV Area (PHT): 5.97 cm     TR Peak grad:   37.0 mmHg MV  Decel Time: 127 msec     TR Vmax:        304.00 cm/s MV E velocity: 102.00 cm/s                             SHUNTS                             Systemic VTI:  0.14 m                             Systemic Diam: 2.00 cm Ida Rogue MD Electronically signed by Ida Rogue MD Signature Date/Time: 10/13/2019/2:34:36 PM    Final     (Echo, Carotid, EGD, Colonoscopy, ERCP)    Subjective: Seen and examined the day of discharge.  No distress.  Daughter at bedside.  Patient stable for discharge to skilled nursing facility.  Discharge Exam: Vitals:   10/17/19 0748 10/17/19 0856  BP: 140/86 (!) 144/82  Pulse: 65 68  Resp: 20   Temp: 97.8 F (36.6 C)   SpO2: 97%    Vitals:   10/16/19 1557 10/17/19 0013 10/17/19 0748 10/17/19 0856  BP: 127/82 (!) 113/59 140/86 (!) 144/82  Pulse: 94 82 65 68  Resp: 17 17 20    Temp: 97.8 F (36.6 C) 97.9 F (36.6 C) 97.8 F (36.6 C)  TempSrc:  Oral Oral   SpO2: 97% 96% 97%   Weight:      Height:        General: Pt is alert, awake, not in acute distress Cardiovascular: RRR, S1/S2 +, no rubs, no gallops Respiratory: CTA bilaterally, no wheezing, no rhonchi Abdominal: Soft, NT, ND, bowel sounds + Extremities: no edema, no cyanosis    The results of significant diagnostics from this hospitalization (including imaging, microbiology, ancillary and laboratory) are listed below for reference.     Microbiology: Recent Results (from the past 240 hour(s))  Respiratory Panel by RT PCR (Flu A&B, Covid) - Nasopharyngeal Swab     Status: None   Collection Time: 10/12/19  4:12 PM   Specimen: Nasopharyngeal Swab  Result Value Ref Range Status   SARS Coronavirus 2 by RT PCR NEGATIVE NEGATIVE Final    Comment: (NOTE) SARS-CoV-2 target nucleic acids are NOT DETECTED.  The SARS-CoV-2 RNA is generally detectable in upper respiratoy specimens during the acute phase of infection. The lowest concentration of SARS-CoV-2 viral copies this assay can detect is 131  copies/mL. A negative result does not preclude SARS-Cov-2 infection and should not be used as the sole basis for treatment or other patient management decisions. A negative result may occur with  improper specimen collection/handling, submission of specimen other than nasopharyngeal swab, presence of viral mutation(s) within the areas targeted by this assay, and inadequate number of viral copies (<131 copies/mL). A negative result must be combined with clinical observations, patient history, and epidemiological information. The expected result is Negative.  Fact Sheet for Patients:  PinkCheek.be  Fact Sheet for Healthcare Providers:  GravelBags.it  This test is no t yet approved or cleared by the [place] FDA and  has been authorized for detection and/or diagnosis of SARS-CoV-2 by FDA under an Emergency Use Authorization (EUA). This EUA will remain  in effect (meaning this test can be used) for the duration of the COVID-19 declaration under Section 564(b)(1) of the Act, 21 U.S.C. section 360bbb-3(b)(1), unless the authorization is terminated or revoked sooner.     Influenza A by PCR NEGATIVE NEGATIVE Final   Influenza B by PCR NEGATIVE NEGATIVE Final    Comment: (NOTE) The Xpert Xpress SARS-CoV-2/FLU/RSV assay is intended as an aid in  the diagnosis of influenza from Nasopharyngeal swab specimens and  should not be used as a sole basis for treatment. Nasal washings and  aspirates are unacceptable for Xpert Xpress SARS-CoV-2/FLU/RSV  testing.  Fact Sheet for Patients: PinkCheek.be  Fact Sheet for Healthcare Providers: GravelBags.it  This test is not yet approved or cleared by the [place] FDA and  has been authorized for detection and/or diagnosis of SARS-CoV-2 by  FDA under an Emergency Use Authorization (EUA). This EUA will remain  in effect (meaning  this test can be used) for the duration of the  Covid-19 declaration under Section 564(b)(1) of the Act, 21  U.S.C. section 360bbb-3(b)(1), unless the authorization is  terminated or revoked. Performed at [facility], [address] [place], [place] [address]   Urine Culture     Status: None   Collection Time: 10/14/19  8:21 AM   Specimen: Urine, Clean Catch  Result Value Ref Range Status   Specimen Description   Final    URINE, CLEAN CATCH Performed at [facility], [address], [place], [place] [address]    Special Requests   Final    NONE Performed at [facility], [place],  [place], [place] [address]    Culture   Final    NO GROWTH Performed at [facility], [place] [address] [place], [place] [address]    Report Status 10/16/2019 FINAL  Final  Respiratory Panel by RT PCR (Flu A&B, Covid) - Nasopharyngeal Swab     Status: None   Collection Time: 10/17/19  9:10 AM   Specimen: Nasopharyngeal Swab  Result Value Ref Range Status   SARS Coronavirus 2 by RT PCR NEGATIVE NEGATIVE Final    Comment: (NOTE) SARS-CoV-2 target nucleic acids are NOT DETECTED.  The SARS-CoV-2 RNA is generally detectable in upper respiratoy specimens during the acute phase of infection. The lowest concentration of SARS-CoV-2 viral copies this assay can detect is 131 copies/mL. A negative result does not preclude SARS-Cov-2 infection and should not be used as the sole basis for treatment or other patient management decisions. A negative result may occur with  improper specimen collection/handling, submission of specimen other than nasopharyngeal swab, presence of viral mutation(s) within the areas targeted by this assay, and inadequate number of viral copies (<131 copies/mL). A negative result must be combined with clinical observations, patient history, and epidemiological information. The expected result is Negative.  Fact Sheet for Patients:   PinkCheek.be  Fact Sheet for Healthcare Providers:  GravelBags.it  This test is no t yet approved or cleared by the [place] FDA and  has been authorized for detection and/or diagnosis of SARS-CoV-2 by FDA under an Emergency Use Authorization (EUA). This EUA will remain  in effect (meaning this test can be used) for the duration of the COVID-19 declaration under Section 564(b)(1) of the Act, 21 U.S.C. section 360bbb-3(b)(1), unless the authorization is terminated or revoked sooner.     Influenza A by PCR NEGATIVE NEGATIVE Final   Influenza B by PCR NEGATIVE NEGATIVE Final    Comment: (NOTE) The Xpert Xpress SARS-CoV-2/FLU/RSV assay is intended as an aid in  the diagnosis of influenza from Nasopharyngeal swab specimens and  should not be used as a sole basis for treatment. Nasal washings and  aspirates are unacceptable for Xpert Xpress SARS-CoV-2/FLU/RSV  testing.  Fact Sheet for Patients: PinkCheek.be  Fact Sheet for Healthcare Providers: GravelBags.it  This test is not yet approved or cleared by the [place] FDA and  has been authorized for detection and/or diagnosis of SARS-CoV-2 by  FDA under an Emergency Use Authorization (EUA). This EUA will remain  in effect (meaning this test can be used) for the duration of the  Covid-19 declaration under Section 564(b)(1) of the Act, 21  U.S.C. section 360bbb-3(b)(1), unless the authorization is  terminated or revoked. Performed at [facility], [address] [place], [place] [address]      Labs: BNP (last 3 results) Recent Labs    10/12/19 1612  BNP 174.0*   Basic Metabolic Panel: Recent Labs  Lab 10/12/19 1612 10/13/19 0159 10/15/19 0749 10/17/19 0316  NA 130* 134* 136 138  K 3.8 3.4* 3.2* 3.0*  CL 94* 100 100 101  CO2 25 28 27 29   GLUCOSE 135* 120* 99 105*  BUN 38* 30* 22 16   CREATININE 1.23* 1.05* 1.05* 0.79  CALCIUM 8.0* 7.6* 7.9* 7.9*  MG  --   --  1.8 1.5*   Liver Function Tests: Recent Labs  Lab 10/12/19 1612  AST 46*  ALT 20  ALKPHOS 71  BILITOT 1.3*  PROT 6.8  ALBUMIN 2.9*   Recent Labs  Lab 10/12/19 1612  LIPASE 30  No results for input(s): AMMONIA in the last 168 hours. CBC: Recent Labs  Lab 10/12/19 1612 10/13/19 0159 10/15/19 0749 10/17/19 0316  WBC 12.2* 11.4* 6.5 6.1  NEUTROABS 10.3*  --  4.8 4.5  HGB 10.3* 8.7* 9.9* 9.8*  HCT 32.3* 27.9* 31.9* 30.4*  MCV 90.0 90.3 90.6 87.6  PLT 91* 89* 79* 118*   Cardiac Enzymes: No results for input(s): CKTOTAL, CKMB, CKMBINDEX, TROPONINI in the last 168 hours. BNP: Invalid input(s): POCBNP CBG: No results for input(s): GLUCAP in the last 168 hours. D-Dimer No results for input(s): DDIMER in the last 72 hours. Hgb A1c No results for input(s): HGBA1C in the last 72 hours. Lipid Profile No results for input(s): CHOL, HDL, LDLCALC, TRIG, CHOLHDL, LDLDIRECT in the last 72 hours. Thyroid function studies No results for input(s): TSH, T4TOTAL, T3FREE, THYROIDAB in the last 72 hours.  Invalid input(s): FREET3 Anemia work up No results for input(s): VITAMINB12, FOLATE, FERRITIN, TIBC, IRON, RETICCTPCT in the last 72 hours. Urinalysis    Component Value Date/Time   COLORURINE YELLOW (A) 10/14/2019 1422   APPEARANCEUR CLOUDY (A) 10/14/2019 1422   APPEARANCEUR Clear 12/24/2016 1350   LABSPEC 1.016 10/14/2019 1422   PHURINE 6.0 10/14/2019 1422   GLUCOSEU NEGATIVE 10/14/2019 1422   HGBUR LARGE (A) 10/14/2019 1422   BILIRUBINUR NEGATIVE 10/14/2019 1422   BILIRUBINUR Negative 12/24/2016 1350   KETONESUR NEGATIVE 10/14/2019 1422   PROTEINUR 100 (A) 10/14/2019 1422   NITRITE NEGATIVE 10/14/2019 1422   LEUKOCYTESUR TRACE (A) 10/14/2019 1422   Sepsis Labs Invalid input(s): PROCALCITONIN,  WBC,  LACTICIDVEN Microbiology Recent Results (from the past 240 hour(s))  Respiratory Panel  by RT PCR (Flu A&B, Covid) - Nasopharyngeal Swab     Status: None   Collection Time: 10/12/19  4:12 PM   Specimen: Nasopharyngeal Swab  Result Value Ref Range Status   SARS Coronavirus 2 by RT PCR NEGATIVE NEGATIVE Final    Comment: (NOTE) SARS-CoV-2 target nucleic acids are NOT DETECTED.  The SARS-CoV-2 RNA is generally detectable in upper respiratoy specimens during the acute phase of infection. The lowest concentration of SARS-CoV-2 viral copies this assay can detect is 131 copies/mL. A negative result does not preclude SARS-Cov-2 infection and should not be used as the sole basis for treatment or other patient management decisions. A negative result may occur with  improper specimen collection/handling, submission of specimen other than nasopharyngeal swab, presence of viral mutation(s) within the areas targeted by this assay, and inadequate number of viral copies (<131 copies/mL). A negative result must be combined with clinical observations, patient history, and epidemiological information. The expected result is Negative.  Fact Sheet for Patients:  PinkCheek.be  Fact Sheet for Healthcare Providers:  GravelBags.it  This test is no t yet approved or cleared by the [place] FDA and  has been authorized for detection and/or diagnosis of SARS-CoV-2 by FDA under an Emergency Use Authorization (EUA). This EUA will remain  in effect (meaning this test can be used) for the duration of the COVID-19 declaration under Section 564(b)(1) of the Act, 21 U.S.C. section 360bbb-3(b)(1), unless the authorization is terminated or revoked sooner.     Influenza A by PCR NEGATIVE NEGATIVE Final   Influenza B by PCR NEGATIVE NEGATIVE Final    Comment: (NOTE) The Xpert Xpress SARS-CoV-2/FLU/RSV assay is intended as an aid in  the diagnosis of influenza from Nasopharyngeal swab specimens and  should not be used as a sole basis for  treatment. Nasal washings and  aspirates are unacceptable for Xpert Xpress SARS-CoV-2/FLU/RSV  testing.  Fact Sheet for Patients: PinkCheek.be  Fact Sheet for Healthcare Providers: GravelBags.it  This test is not yet approved or cleared by the [place] FDA and  has been authorized for detection and/or diagnosis of SARS-CoV-2 by  FDA under an Emergency Use Authorization (EUA). This EUA will remain  in effect (meaning this test can be used) for the duration of the  Covid-19 declaration under Section 564(b)(1) of the Act, 21  U.S.C. section 360bbb-3(b)(1), unless the authorization is  terminated or revoked. Performed at [facility], [address] [place], [place] [address]   Urine Culture     Status: None   Collection Time: 10/14/19  8:21 AM   Specimen: Urine, Clean Catch  Result Value Ref Range Status   Specimen Description   Final    URINE, CLEAN CATCH Performed at [facility], [address], [place], [place] [address]    Special Requests   Final    NONE Performed at [facility], [address] [place], [place] [address]    Culture   Final    NO GROWTH Performed at [facility], [place] [address], [place], [place] [address]    Report Status 10/16/2019 FINAL  Final  Respiratory Panel by RT PCR (Flu A&B, Covid) - Nasopharyngeal Swab     Status: None   Collection Time: 10/17/19  9:10 AM   Specimen: Nasopharyngeal Swab  Result Value Ref Range Status   SARS Coronavirus 2 by RT PCR NEGATIVE NEGATIVE Final    Comment: (NOTE) SARS-CoV-2 target nucleic acids are NOT DETECTED.  The SARS-CoV-2 RNA is generally detectable in upper respiratoy specimens during the acute phase of infection. The lowest concentration of SARS-CoV-2 viral copies this assay can detect is 131 copies/mL. A negative result does not preclude SARS-Cov-2 infection and should not be used as the sole basis for  treatment or other patient management decisions. A negative result may occur with  improper specimen collection/handling, submission of specimen other than nasopharyngeal swab, presence of viral mutation(s) within the areas targeted by this assay, and inadequate number of viral copies (<131 copies/mL). A negative result must be combined with clinical observations, patient history, and epidemiological information. The expected result is Negative.  Fact Sheet for Patients:  PinkCheek.be  Fact Sheet for Healthcare Providers:  GravelBags.it  This test is no t yet approved or cleared by the [place] FDA and  has been authorized for detection and/or diagnosis of SARS-CoV-2 by FDA under an Emergency Use Authorization (EUA). This EUA will remain  in effect (meaning this test can be used) for the duration of the COVID-19 declaration under Section 564(b)(1) of the Act, 21 U.S.C. section 360bbb-3(b)(1), unless the authorization is terminated or revoked sooner.     Influenza A by PCR NEGATIVE NEGATIVE Final   Influenza B by PCR NEGATIVE NEGATIVE Final    Comment: (NOTE) The Xpert Xpress SARS-CoV-2/FLU/RSV assay is intended as an aid in  the diagnosis of influenza from Nasopharyngeal swab specimens and  should not be used as a sole basis for treatment. Nasal washings and  aspirates are unacceptable for Xpert Xpress SARS-CoV-2/FLU/RSV  testing.  Fact Sheet for Patients: PinkCheek.be  Fact Sheet for Healthcare Providers: GravelBags.it  This test is not yet approved or cleared by the [place] FDA and  has been authorized for detection and/or diagnosis of SARS-CoV-2 by  FDA under an Emergency Use Authorization (EUA). This EUA will remain  in effect (meaning this test can be used) for the duration of the  Covid-19 declaration under Section 564(b)(1) of the Act, 21   U.S.C. section 360bbb-3(b)(1), unless the authorization is  terminated or revoked. Performed at [facility], [address] [place], [place] [address]      Time coordinating discharge: Over 30 minutes  SIGNED:   Sidney Ace, MD  [facility] 10/17/2019, 11:27 AM Pager   If 7PM-7AM, please contact night-coverage

## 2019-11-11 ENCOUNTER — Encounter: Payer: Self-pay | Admitting: Internal Medicine

## 2019-11-11 ENCOUNTER — Other Ambulatory Visit: Payer: Self-pay

## 2019-11-11 ENCOUNTER — Emergency Department: Payer: Medicare Other

## 2019-11-11 ENCOUNTER — Inpatient Hospital Stay
Admission: EM | Admit: 2019-11-11 | Discharge: 2019-11-20 | DRG: 871 | Disposition: E | Payer: Medicare Other | Attending: Internal Medicine | Admitting: Internal Medicine

## 2019-11-11 ENCOUNTER — Inpatient Hospital Stay: Payer: Medicare Other

## 2019-11-11 DIAGNOSIS — Z8544 Personal history of malignant neoplasm of other female genital organs: Secondary | ICD-10-CM

## 2019-11-11 DIAGNOSIS — I4821 Permanent atrial fibrillation: Secondary | ICD-10-CM | POA: Diagnosis present

## 2019-11-11 DIAGNOSIS — A419 Sepsis, unspecified organism: Secondary | ICD-10-CM | POA: Diagnosis not present

## 2019-11-11 DIAGNOSIS — R945 Abnormal results of liver function studies: Secondary | ICD-10-CM

## 2019-11-11 DIAGNOSIS — E872 Acidosis: Secondary | ICD-10-CM | POA: Diagnosis present

## 2019-11-11 DIAGNOSIS — I130 Hypertensive heart and chronic kidney disease with heart failure and stage 1 through stage 4 chronic kidney disease, or unspecified chronic kidney disease: Secondary | ICD-10-CM | POA: Diagnosis present

## 2019-11-11 DIAGNOSIS — K7290 Hepatic failure, unspecified without coma: Secondary | ICD-10-CM | POA: Diagnosis present

## 2019-11-11 DIAGNOSIS — X58XXXA Exposure to other specified factors, initial encounter: Secondary | ICD-10-CM | POA: Diagnosis present

## 2019-11-11 DIAGNOSIS — J440 Chronic obstructive pulmonary disease with acute lower respiratory infection: Secondary | ICD-10-CM | POA: Diagnosis present

## 2019-11-11 DIAGNOSIS — R5383 Other fatigue: Secondary | ICD-10-CM | POA: Diagnosis not present

## 2019-11-11 DIAGNOSIS — R7881 Bacteremia: Secondary | ICD-10-CM | POA: Diagnosis not present

## 2019-11-11 DIAGNOSIS — Z66 Do not resuscitate: Secondary | ICD-10-CM | POA: Diagnosis present

## 2019-11-11 DIAGNOSIS — F0390 Unspecified dementia without behavioral disturbance: Secondary | ICD-10-CM | POA: Diagnosis present

## 2019-11-11 DIAGNOSIS — F32A Depression, unspecified: Secondary | ICD-10-CM | POA: Diagnosis present

## 2019-11-11 DIAGNOSIS — J189 Pneumonia, unspecified organism: Secondary | ICD-10-CM | POA: Diagnosis present

## 2019-11-11 DIAGNOSIS — K5730 Diverticulosis of large intestine without perforation or abscess without bleeding: Secondary | ICD-10-CM | POA: Diagnosis present

## 2019-11-11 DIAGNOSIS — I272 Pulmonary hypertension, unspecified: Secondary | ICD-10-CM | POA: Diagnosis present

## 2019-11-11 DIAGNOSIS — D649 Anemia, unspecified: Secondary | ICD-10-CM | POA: Diagnosis not present

## 2019-11-11 DIAGNOSIS — T68XXXA Hypothermia, initial encounter: Secondary | ICD-10-CM | POA: Diagnosis not present

## 2019-11-11 DIAGNOSIS — I2510 Atherosclerotic heart disease of native coronary artery without angina pectoris: Secondary | ICD-10-CM | POA: Diagnosis present

## 2019-11-11 DIAGNOSIS — I10 Essential (primary) hypertension: Secondary | ICD-10-CM | POA: Diagnosis present

## 2019-11-11 DIAGNOSIS — Z79899 Other long term (current) drug therapy: Secondary | ICD-10-CM

## 2019-11-11 DIAGNOSIS — Y95 Nosocomial condition: Secondary | ICD-10-CM | POA: Diagnosis present

## 2019-11-11 DIAGNOSIS — A4181 Sepsis due to Enterococcus: Principal | ICD-10-CM | POA: Diagnosis present

## 2019-11-11 DIAGNOSIS — I482 Chronic atrial fibrillation, unspecified: Secondary | ICD-10-CM | POA: Diagnosis not present

## 2019-11-11 DIAGNOSIS — Z515 Encounter for palliative care: Secondary | ICD-10-CM | POA: Diagnosis not present

## 2019-11-11 DIAGNOSIS — E039 Hypothyroidism, unspecified: Secondary | ICD-10-CM | POA: Diagnosis present

## 2019-11-11 DIAGNOSIS — Z20822 Contact with and (suspected) exposure to covid-19: Secondary | ICD-10-CM | POA: Diagnosis present

## 2019-11-11 DIAGNOSIS — K219 Gastro-esophageal reflux disease without esophagitis: Secondary | ICD-10-CM | POA: Diagnosis present

## 2019-11-11 DIAGNOSIS — Z9181 History of falling: Secondary | ICD-10-CM

## 2019-11-11 DIAGNOSIS — I509 Heart failure, unspecified: Secondary | ICD-10-CM | POA: Diagnosis present

## 2019-11-11 DIAGNOSIS — I739 Peripheral vascular disease, unspecified: Secondary | ICD-10-CM | POA: Diagnosis present

## 2019-11-11 DIAGNOSIS — Z888 Allergy status to other drugs, medicaments and biological substances status: Secondary | ICD-10-CM

## 2019-11-11 DIAGNOSIS — G9341 Metabolic encephalopathy: Secondary | ICD-10-CM | POA: Diagnosis present

## 2019-11-11 DIAGNOSIS — Z85820 Personal history of malignant melanoma of skin: Secondary | ICD-10-CM

## 2019-11-11 DIAGNOSIS — Z923 Personal history of irradiation: Secondary | ICD-10-CM | POA: Diagnosis not present

## 2019-11-11 DIAGNOSIS — R531 Weakness: Secondary | ICD-10-CM | POA: Diagnosis not present

## 2019-11-11 DIAGNOSIS — R571 Hypovolemic shock: Secondary | ICD-10-CM | POA: Diagnosis present

## 2019-11-11 DIAGNOSIS — I4891 Unspecified atrial fibrillation: Secondary | ICD-10-CM | POA: Diagnosis not present

## 2019-11-11 DIAGNOSIS — E0590 Thyrotoxicosis, unspecified without thyrotoxic crisis or storm: Secondary | ICD-10-CM | POA: Diagnosis present

## 2019-11-11 DIAGNOSIS — Z87891 Personal history of nicotine dependence: Secondary | ICD-10-CM

## 2019-11-11 DIAGNOSIS — N1831 Chronic kidney disease, stage 3a: Secondary | ICD-10-CM | POA: Diagnosis present

## 2019-11-11 DIAGNOSIS — R6521 Severe sepsis with septic shock: Secondary | ICD-10-CM

## 2019-11-11 DIAGNOSIS — Z90710 Acquired absence of both cervix and uterus: Secondary | ICD-10-CM

## 2019-11-11 DIAGNOSIS — N179 Acute kidney failure, unspecified: Secondary | ICD-10-CM | POA: Diagnosis present

## 2019-11-11 DIAGNOSIS — J90 Pleural effusion, not elsewhere classified: Secondary | ICD-10-CM

## 2019-11-11 DIAGNOSIS — R7989 Other specified abnormal findings of blood chemistry: Secondary | ICD-10-CM | POA: Diagnosis present

## 2019-11-11 DIAGNOSIS — F419 Anxiety disorder, unspecified: Secondary | ICD-10-CM | POA: Diagnosis present

## 2019-11-11 DIAGNOSIS — Z841 Family history of disorders of kidney and ureter: Secondary | ICD-10-CM

## 2019-11-11 DIAGNOSIS — Z7982 Long term (current) use of aspirin: Secondary | ICD-10-CM

## 2019-11-11 DIAGNOSIS — R109 Unspecified abdominal pain: Secondary | ICD-10-CM

## 2019-11-11 DIAGNOSIS — Z7189 Other specified counseling: Secondary | ICD-10-CM | POA: Diagnosis not present

## 2019-11-11 DIAGNOSIS — B952 Enterococcus as the cause of diseases classified elsewhere: Secondary | ICD-10-CM | POA: Diagnosis not present

## 2019-11-11 DIAGNOSIS — D631 Anemia in chronic kidney disease: Secondary | ICD-10-CM | POA: Diagnosis present

## 2019-11-11 DIAGNOSIS — Z885 Allergy status to narcotic agent status: Secondary | ICD-10-CM

## 2019-11-11 LAB — CBC WITH DIFFERENTIAL/PLATELET
Abs Immature Granulocytes: 0.07 10*3/uL (ref 0.00–0.07)
Basophils Absolute: 0 10*3/uL (ref 0.0–0.1)
Basophils Relative: 0 %
Eosinophils Absolute: 0 10*3/uL (ref 0.0–0.5)
Eosinophils Relative: 0 %
HCT: 35.5 % — ABNORMAL LOW (ref 36.0–46.0)
Hemoglobin: 10.2 g/dL — ABNORMAL LOW (ref 12.0–15.0)
Immature Granulocytes: 1 %
Lymphocytes Relative: 12 %
Lymphs Abs: 0.8 10*3/uL (ref 0.7–4.0)
MCH: 28.5 pg (ref 26.0–34.0)
MCHC: 28.7 g/dL — ABNORMAL LOW (ref 30.0–36.0)
MCV: 99.2 fL (ref 80.0–100.0)
Monocytes Absolute: 0.6 10*3/uL (ref 0.1–1.0)
Monocytes Relative: 10 %
Neutro Abs: 4.8 10*3/uL (ref 1.7–7.7)
Neutrophils Relative %: 77 %
Platelets: 144 10*3/uL — ABNORMAL LOW (ref 150–400)
RBC: 3.58 MIL/uL — ABNORMAL LOW (ref 3.87–5.11)
RDW: 18.4 % — ABNORMAL HIGH (ref 11.5–15.5)
WBC: 6.3 10*3/uL (ref 4.0–10.5)
nRBC: 0 % (ref 0.0–0.2)

## 2019-11-11 LAB — PROCALCITONIN: Procalcitonin: <0.1 ng/mL

## 2019-11-11 LAB — LACTIC ACID, PLASMA
Lactic Acid, Venous: 10.3 mmol/L (ref 0.5–1.9)
Lactic Acid, Venous: 4.2 mmol/L (ref 0.5–1.9)
Lactic Acid, Venous: 5.2 mmol/L (ref 0.5–1.9)
Lactic Acid, Venous: 6.3 mmol/L (ref 0.5–1.9)
Lactic Acid, Venous: 4.7 mmol/L (ref 0.5–1.9)

## 2019-11-11 LAB — PROTIME-INR
INR: 1.9 — ABNORMAL HIGH (ref 0.8–1.2)
Prothrombin Time: 20.9 seconds — ABNORMAL HIGH (ref 11.4–15.2)

## 2019-11-11 LAB — URINALYSIS, COMPLETE (UACMP) WITH MICROSCOPIC
Bilirubin Urine: NEGATIVE
Glucose, UA: 50 mg/dL — AB
Ketones, ur: NEGATIVE mg/dL
Leukocytes,Ua: NEGATIVE
Nitrite: NEGATIVE
Protein, ur: >=300 mg/dL — AB
RBC / HPF: >50 RBC/hpf — ABNORMAL HIGH (ref 0–5)
Specific Gravity, Urine: 1.024 (ref 1.005–1.030)
WBC, UA: >50 WBC/hpf — ABNORMAL HIGH (ref 0–5)
pH: 5 (ref 5.0–8.0)

## 2019-11-11 LAB — COMPREHENSIVE METABOLIC PANEL
ALT: 53 U/L — ABNORMAL HIGH (ref 0–44)
AST: 104 U/L — ABNORMAL HIGH (ref 15–41)
Albumin: 3.5 g/dL (ref 3.5–5.0)
Alkaline Phosphatase: 84 U/L (ref 38–126)
Anion gap: 19 — ABNORMAL HIGH (ref 5–15)
BUN: 30 mg/dL — ABNORMAL HIGH (ref 8–23)
CO2: 20 mmol/L — ABNORMAL LOW (ref 22–32)
Calcium: 8.9 mg/dL (ref 8.9–10.3)
Chloride: 99 mmol/L (ref 98–111)
Creatinine, Ser: 1.7 mg/dL — ABNORMAL HIGH (ref 0.44–1.00)
GFR, Estimated: 28 mL/min — ABNORMAL LOW (ref >60)
Glucose, Bld: 129 mg/dL — ABNORMAL HIGH (ref 70–99)
Potassium: 4.6 mmol/L (ref 3.5–5.1)
Sodium: 138 mmol/L (ref 135–145)
Total Bilirubin: 1.6 mg/dL — ABNORMAL HIGH (ref 0.3–1.2)
Total Protein: 7.7 g/dL (ref 6.5–8.1)

## 2019-11-11 LAB — TROPONIN I (HIGH SENSITIVITY)
Troponin I (High Sensitivity): 16 ng/L (ref <18)
Troponin I (High Sensitivity): 8 ng/L (ref <18)

## 2019-11-11 LAB — MAGNESIUM: Magnesium: 2.1 mg/dL (ref 1.7–2.4)

## 2019-11-11 LAB — RESPIRATORY PANEL BY RT PCR (FLU A&B, COVID)
Influenza A by PCR: NEGATIVE
Influenza B by PCR: NEGATIVE
SARS Coronavirus 2 by RT PCR: NEGATIVE

## 2019-11-11 LAB — LACTATE DEHYDROGENASE: LDH: 581 U/L — ABNORMAL HIGH (ref 98–192)

## 2019-11-11 LAB — APTT: aPTT: 33 seconds (ref 24–36)

## 2019-11-11 MED ORDER — SODIUM CHLORIDE 0.9 % IV SOLN: INTRAVENOUS | Status: DC

## 2019-11-11 MED ORDER — SODIUM CHLORIDE 0.9 % IV SOLN
2.0000 g | INTRAVENOUS | Status: DC
  Administered 2019-11-12: 2 g via INTRAVENOUS
  Filled 2019-11-11: qty 2

## 2019-11-11 MED ORDER — DM-GUAIFENESIN ER 30-600 MG PO TB12: 1.0000 | ORAL_TABLET | Freq: Two times a day (BID) | ORAL | Status: DC | PRN

## 2019-11-11 MED ORDER — PANTOPRAZOLE SODIUM 40 MG PO TBEC
40.0000 mg | DELAYED_RELEASE_TABLET | Freq: Every day | ORAL | Status: DC
  Administered 2019-11-11 – 2019-11-13 (×3): 40 mg via ORAL
  Filled 2019-11-11 (×3): qty 1

## 2019-11-11 MED ORDER — SODIUM CHLORIDE 0.9 % IV SOLN
1.0000 g | Freq: Once | INTRAVENOUS | Status: AC
  Administered 2019-11-11: 1 g via INTRAVENOUS
  Filled 2019-11-11: qty 1

## 2019-11-11 MED ORDER — MIRTAZAPINE 15 MG PO TABS
15.0000 mg | ORAL_TABLET | Freq: Every day | ORAL | Status: DC
  Administered 2019-11-12: 15 mg via ORAL
  Filled 2019-11-11 (×2): qty 1

## 2019-11-11 MED ORDER — METHIMAZOLE 5 MG PO TABS
5.0000 mg | ORAL_TABLET | Freq: Every day | ORAL | Status: DC
  Administered 2019-11-12 – 2019-11-13 (×2): 5 mg via ORAL
  Filled 2019-11-11 (×4): qty 1

## 2019-11-11 MED ORDER — VANCOMYCIN HCL 750 MG/150ML IV SOLN: 750.0000 mg | INTRAVENOUS | Status: DC

## 2019-11-11 MED ORDER — SODIUM CHLORIDE 0.9 % IV BOLUS
500.0000 mL | Freq: Once | INTRAVENOUS | Status: AC
  Administered 2019-11-11: 500 mL via INTRAVENOUS

## 2019-11-11 MED ORDER — ALBUTEROL SULFATE (2.5 MG/3ML) 0.083% IN NEBU: 2.5000 mg | INHALATION_SOLUTION | RESPIRATORY_TRACT | Status: DC | PRN

## 2019-11-11 MED ORDER — ALPRAZOLAM 0.25 MG PO TABS
0.2500 mg | ORAL_TABLET | Freq: Every day | ORAL | Status: DC | PRN
  Administered 2019-11-12: 0.25 mg via ORAL
  Filled 2019-11-11: qty 1

## 2019-11-11 MED ORDER — ESTROGENS, CONJUGATED 0.625 MG/GM VA CREA
1.0000 | TOPICAL_CREAM | Freq: Every day | VAGINAL | Status: DC
  Administered 2019-11-12 – 2019-11-13 (×2): 1 via VAGINAL
  Filled 2019-11-11 (×2): qty 30

## 2019-11-11 MED ORDER — SODIUM CHLORIDE 0.9 % IV SOLN: Freq: Once | INTRAVENOUS | Status: AC

## 2019-11-11 MED ORDER — ONDANSETRON HCL 4 MG/2ML IJ SOLN: 4.0000 mg | Freq: Three times a day (TID) | INTRAMUSCULAR | Status: DC | PRN

## 2019-11-11 MED ORDER — ASPIRIN EC 81 MG PO TBEC
81.0000 mg | DELAYED_RELEASE_TABLET | ORAL | Status: DC
  Administered 2019-11-11 – 2019-11-13 (×2): 81 mg via ORAL
  Filled 2019-11-11 (×2): qty 1

## 2019-11-11 MED ORDER — VANCOMYCIN HCL IN DEXTROSE 1-5 GM/200ML-% IV SOLN
1000.0000 mg | Freq: Once | INTRAVENOUS | Status: AC
  Administered 2019-11-11: 1000 mg via INTRAVENOUS
  Filled 2019-11-11: qty 200

## 2019-11-11 MED ORDER — LACTATED RINGERS IV BOLUS: 1000.0000 mL | Freq: Once | INTRAVENOUS | Status: DC

## 2019-11-11 MED ORDER — HYDROCORTISONE NA SUCCINATE PF 100 MG IJ SOLR
50.0000 mg | Freq: Once | INTRAMUSCULAR | Status: AC
  Administered 2019-11-11: 50 mg via INTRAVENOUS
  Filled 2019-11-11: qty 2

## 2019-11-11 NOTE — Progress Notes (Signed)
PHARMACY -  BRIEF ANTIBIOTIC NOTE   Pharmacy has received consult(s) for Vancomycin from an ED provider.  The patient's profile has been reviewed for ht/wt/allergies/indication/available labs.    One time order(s) placed for Vancomycin 1g   Further antibiotics/pharmacy consults should be ordered by admitting physician if indicated.                       Thank you, Pernell Dupre, PharmD, BCPS Clinical Pharmacist 11/12/2019 11:45 AM

## 2019-11-11 NOTE — ED Notes (Signed)
Dr Niu at bedside 

## 2019-11-11 NOTE — ED Notes (Signed)
Moved pt up in bed and repositioned bear hugger and blankets. Pt states bear hugger is too hot and wanted to pull it down. Daughter states pt is more disoriented now than earlier. Pt oriented to place, self, time. Pt is restless but cooperative.

## 2019-11-11 NOTE — ED Notes (Signed)
Daughter at bedside. EDP at bedside talking with daughter. Pt taken to xray by xray tech.

## 2019-11-11 NOTE — Consult Note (Signed)
CODE SEPSIS - PHARMACY COMMUNICATION  **Broad Spectrum Antibiotics should be administered within 1 hour of Sepsis diagnosis**  Time Code Sepsis Called/Page Received: 1429  Antibiotics Ordered: Cefepime and vancomycin   Time of 1st antibiotic administration: 1225  Additional action taken by pharmacy: N/A  If necessary, Name of Provider/Nurse Contacted: N/A   Benn Moulder, PharmD Pharmacy Resident  11/01/2019 2:48 PM

## 2019-11-11 NOTE — ED Notes (Signed)
Pt states needs to have BM. Put bedpan under pt.

## 2019-11-11 NOTE — ED Provider Notes (Signed)
[facility] Emergency Department Provider Note ____________________________________________   First MD Initiated Contact with Patient 11/07/2019 1121     (approximate)  I have reviewed the triage vital signs and the nursing notes.  HISTORY  Chief Complaint Weakness   HPI Kristin Rowe is a 86 y.o. femalewho presents to the ED for evaluation of weakness and shivering.   Chart review indicates hx PVD, carotid stenosis s/p right endarterectomy, cervical cancer s/p radiation, HTN, GERD, anxiety.  DNR status. Recent admission from 9/23-9/28 for pyelonephritis Palliative was consulted on this recent admission and patient is DNR status.  Anticoagulation deferred with new onset A. fib during this admission.  Start on metoprolol 100mg .   Patient presents from home due to few days of generalized weakness. Patient is unable to provide any relevant history due to her disorientation, altered mental status and acute illness.  Later history provided by the daughter in the room indicates patient returned home from postacute rehab 2 weeks ago.  Daughter reports that she has been declining since then and has really noticed patient being more weak the past "couple days" that was reminiscent of her previous admission for sepsis due to pyelonephritis.    Past Medical History:  Diagnosis Date  . Anxiety   . Arthritis   . Back pain    chronic  . Cancer [facility])    Vaginal - radiation treatments/mohs for melanoma left eyelid  . COPD (chronic obstructive pulmonary disease) ([place])   . Dizziness    in am  . Heart murmur   . Hypertension   . Hyperthyroidism   . Peripheral vascular disease [facility])     Patient Active Problem List   Diagnosis Date Noted  . Sepsis ([place]) 10/12/2019  . Acute pyelonephritis 10/12/2019  . Atrial fibrillation with RVR ([place]) 10/12/2019  . Elevated troponin 10/12/2019  . Anemia 10/12/2019  . Cervical cancer ([place]) 01/09/2018  . Thyroid disease  01/09/2018  . Venous stasis 01/09/2018  . Melanoma in situ of eyelid, left ([place]) 03/02/2017  . Essential hypertension 11/01/2015  . Carotid stenosis, asymptomatic 10/17/2015  . Atypical chest pain 09/30/2015  . PVD (peripheral vascular disease) ([place]) 09/30/2015  . Dizziness 09/25/2015  . Osteoarthritis 10/20/2013  . Osteoporosis 10/20/2013  . Melanoma in situ ([place]) 04/20/2007    Past Surgical History:  Procedure Laterality Date  . ABDOMINAL HYSTERECTOMY    . ENDARTERECTOMY Right 10/17/2015   Procedure: ENDARTERECTOMY CAROTID;  Surgeon: Algernon Huxley, MD;  Location: [facility] ORS;  Service: Vascular;  Laterality: Right;  . MOHS SURGERY    . vaginectomy     partial    Prior to Admission medications   Medication Sig Start Date End Date Taking? Authorizing Provider  ALPRAZolam (XANAX) 0.25 MG tablet Take 0.25 mg by mouth 3 (three) times daily as needed for anxiety.    [provider]  aspirin EC 81 MG tablet Take 81 mg every other day by mouth.     [provider]  Clobetasol Prop Emollient Base (CLOBETASOL PROPIONATE E) 0.05 % emollient cream Apply 1 application topically 2 (two) times daily. 08/08/19   Zara Council A, PA-C  COD LIVER OIL PO Take by mouth. Every other day    [provider]  conjugated estrogens (PREMARIN) vaginal cream Place 1 Applicatorful vaginally daily. Apply 0.5mg  (pea-sized amount)  just inside the vaginal introitus with a finger-tip on  Monday, Wednesday and Friday nights. 08/08/19   Zara Council A, PA-C  Lidocaine 2 % GEL Apply  1 Act topically as needed. 08/30/19   Zara Council A, PA-C  methimazole (TAPAZOLE) 5 MG tablet Take 5 mg by mouth daily.    [provider]  metoprolol (LOPRESSOR) 100 MG tablet Take 100 mg by mouth 2 (two) times daily.     [provider]  mirtazapine (REMERON) 15 MG tablet Take 15 mg by mouth at bedtime.  01/25/18 10/12/19  [provider]  pantoprazole (PROTONIX) 40 MG tablet Take  40 mg by mouth daily. 08/25/19   [provider]  sodium chloride (OCEAN) 0.65 % SOLN nasal spray Place 1 spray into both nostrils as needed for congestion. 10/17/19   Sidney Ace, MD    Allergies Daypro [oxaprozin], Morphine and related, Plaquenil [hydroxychloroquine sulfate], and Ridaura [auranofin]  Family History  Problem Relation Age of Onset  . Kidney disease Sister   . Kidney Stones Sister   . Kidney cancer Neg Hx   . Prostate cancer Neg Hx     Social History Social History   Tobacco Use  . Smoking status: Former Smoker    Quit date: 10/09/1985    Years since quitting: 34.1  . Smokeless tobacco: Never Used  Vaping Use  . Vaping Use: Never used  Substance Use Topics  . Alcohol use: No  . Drug use: No    Review of Systems  Unable to be accurately assessed due to patient's altered mental status. ____________________________________________   PHYSICAL EXAM:  VITAL SIGNS: Vitals:   10/21/2019 1200 11/18/2019 1215  BP: 107/82   Pulse: 87 (!) 131  Resp: (!) 23 (!) 9  Temp: (!) 91.1 F (32.8 C) (!) 92.7 F (33.7 C)  SpO2: 94% 97%      Constitutional: Alert, disoriented and shivering beneath blankets.  She follows commands in all 4 extremities without evidence of focal deficit. Eyes: Conjunctivae are normal. PERRL. EOMI. Head: Small superficial abrasion to her left maxilla without underlying bony step-offs or evidence of EOM entrapment. Nose: No congestion/rhinnorhea. Mouth/Throat: Mucous membranes are dry.  Oropharynx non-erythematous. Neck: No stridor. No cervical spine tenderness to palpation. Cardiovascular: Tachycardic and irregular. Grossly normal heart sounds.  Good peripheral circulation. Respiratory: Normal respiratory effort.  No retractions.  Bibasilar crackles noted, otherwise clear. Gastrointestinal: Soft , nondistended. No abdominal bruits. No CVA tenderness. Mild and diffuse tenderness to palpation without peritoneal or localizing  features. Musculoskeletal: No lower extremity tenderness nor edema.  No joint effusions. No signs of acute trauma. Neurologic:  Normal speech and language. No gross focal neurologic deficits are appreciated. No gait instability noted. Skin:  Skin is cold, dry and intact. No rash noted. Psychiatric: Mood and affect are normal. Speech and behavior are normal.  ____________________________________________   LABS (all labs ordered are listed, but only abnormal results are displayed)  Labs Reviewed  CBC WITH DIFFERENTIAL/PLATELET - Abnormal; Notable for the following components:      Result Value   RBC 3.58 (*)    Hemoglobin 10.2 (*)    HCT 35.5 (*)    MCHC 28.7 (*)    RDW 18.4 (*)    Platelets 144 (*)    All other components within normal limits  COMPREHENSIVE METABOLIC PANEL - Abnormal; Notable for the following components:   CO2 20 (*)    Glucose, Bld 129 (*)    BUN 30 (*)    Creatinine, Ser 1.70 (*)    AST 104 (*)    ALT 53 (*)    Total Bilirubin 1.6 (*)    GFR,  Estimated 28 (*)    Anion gap 19 (*)    All other components within normal limits  LACTIC ACID, PLASMA - Abnormal; Notable for the following components:   Lactic Acid, Venous 10.3 (*)    All other components within normal limits  RESPIRATORY PANEL BY RT PCR (FLU A&B, COVID)  CULTURE, BLOOD (ROUTINE X 2)  CULTURE, BLOOD (ROUTINE X 2)  URINE CULTURE  MAGNESIUM  URINALYSIS, COMPLETE (UACMP) WITH MICROSCOPIC  LACTIC ACID, PLASMA  TROPONIN I (HIGH SENSITIVITY)  TROPONIN I (HIGH SENSITIVITY)   ____________________________________________  12 Lead EKG  A. fib, rate of 106.  Normal axis.  Normal intervals.  No evidence of acute ischemia.  Poor quality EKG with wandering baseline. ____________________________________________  RADIOLOGY  ED MD interpretation: CXR with trace bilateral pleural effusions  Official radiology report(s): CT ABDOMEN PELVIS WO CONTRAST  Result Date: 11/08/2019 CLINICAL DATA:   86 year old female with shortness of breath, chest and abdominal pain, and bibasilar opacities on recent chest radiograph. EXAM: CT CHEST, ABDOMEN AND PELVIS WITHOUT CONTRAST TECHNIQUE: Multidetector CT imaging of the chest, abdomen and pelvis was performed following the standard protocol without IV contrast. COMPARISON:  10/12/2019 abdomen/pelvic CT and 01/04/2009 PET CT FINDINGS: Please note that parenchymal and vascular abnormalities may be missed without intravenous contrast. CT CHEST FINDINGS Cardiovascular: Cardiomegaly and heavy coronary artery and aortic atherosclerotic calcifications are again noted. There is no evidence of thoracic aortic aneurysm or pericardial effusion. Mediastinum/Nodes: An enlarged LEFT thyroid gland is unchanged. No abnormal appearing lymph nodes are present. No mediastinal mass or hematoma identified. Lungs/Pleura: Moderate to large bilateral pleural effusions are present with mild to moderate bibasilar atelectasis. Mild ground-glass/interstitial opacities are present, greatest in the central RIGHT lung centrally. Mild biapical pleural-parenchymal scarring again not no pneumothorax noted. Musculoskeletal: Sclerosis within the T10 vertebral body appears to be reactive from adjacent Schmorl's nodes. CT ABDOMEN PELVIS FINDINGS Hepatobiliary: No hepatic or gallbladder abnormalities are identified. No definite biliary dilatation. Pancreas: Unremarkable Spleen: Unremarkable Adrenals/Urinary Tract: The kidneys and adrenal glands are unremarkable. A Foley catheter is present within a collapsed bladder. Stomach/Bowel: Stomach is within normal limits. Appendix appears normal. No evidence of bowel wall thickening, distention, or inflammatory changes. Colonic diverticulosis noted without evidence of acute diverticulitis. Vascular/Lymphatic: Aortic atherosclerosis. No enlarged abdominal or pelvic lymph nodes. Reproductive: Status post hysterectomy. No adnexal masses. Other: A trace amount of free  fluid in the pelvis is noted. Mild subcutaneous edema is present. There is no evidence of focal collection or pneumoperitoneum. Musculoskeletal: No acute or suspicious bony abnormalities are present. IMPRESSION: 1. Moderate to large bilateral pleural effusions with mild to moderate bibasilar atelectasis, trace amount of free pelvic fluid and mild subcutaneous edema. 2. Mild ground-glass/interstitial opacities, greatest in the central RIGHT lung, which may represent mild pulmonary edema or less likely infection. 3. Cardiomegaly and coronary artery disease. 4. Aortic Atherosclerosis (ICD10-I70.0). Electronically Signed   By: Margarette Canada M.D.   On: 11/06/2019 13:08   CT Head Wo Contrast  Result Date: 11/07/2019 CLINICAL DATA:  Dizziness, altered mental status. EXAM: CT HEAD WITHOUT CONTRAST TECHNIQUE: Contiguous axial images were obtained from the base of the skull through the vertex without intravenous contrast. COMPARISON:  October 12, 2019. FINDINGS: Brain: No evidence of acute infarction, hemorrhage, hydrocephalus, extra-axial collection or mass lesion/mass effect. Vascular: No hyperdense vessel or unexpected calcification. Skull: Normal. Negative for fracture or focal lesion. Sinuses/Orbits: No acute finding. Other: None. IMPRESSION: Normal head CT. Electronically Signed   By: Marijo Conception  M.D.   On: 11/09/2019 12:53   CT Chest Wo Contrast  Result Date: 11/04/2019 CLINICAL DATA:  86 year old female with shortness of breath, chest and abdominal pain, and bibasilar opacities on recent chest radiograph. EXAM: CT CHEST, ABDOMEN AND PELVIS WITHOUT CONTRAST TECHNIQUE: Multidetector CT imaging of the chest, abdomen and pelvis was performed following the standard protocol without IV contrast. COMPARISON:  10/12/2019 abdomen/pelvic CT and 01/04/2009 PET CT FINDINGS: Please note that parenchymal and vascular abnormalities may be missed without intravenous contrast. CT CHEST FINDINGS Cardiovascular:  Cardiomegaly and heavy coronary artery and aortic atherosclerotic calcifications are again noted. There is no evidence of thoracic aortic aneurysm or pericardial effusion. Mediastinum/Nodes: An enlarged LEFT thyroid gland is unchanged. No abnormal appearing lymph nodes are present. No mediastinal mass or hematoma identified. Lungs/Pleura: Moderate to large bilateral pleural effusions are present with mild to moderate bibasilar atelectasis. Mild ground-glass/interstitial opacities are present, greatest in the central RIGHT lung centrally. Mild biapical pleural-parenchymal scarring again not no pneumothorax noted. Musculoskeletal: Sclerosis within the T10 vertebral body appears to be reactive from adjacent Schmorl's nodes. CT ABDOMEN PELVIS FINDINGS Hepatobiliary: No hepatic or gallbladder abnormalities are identified. No definite biliary dilatation. Pancreas: Unremarkable Spleen: Unremarkable Adrenals/Urinary Tract: The kidneys and adrenal glands are unremarkable. A Foley catheter is present within a collapsed bladder. Stomach/Bowel: Stomach is within normal limits. Appendix appears normal. No evidence of bowel wall thickening, distention, or inflammatory changes. Colonic diverticulosis noted without evidence of acute diverticulitis. Vascular/Lymphatic: Aortic atherosclerosis. No enlarged abdominal or pelvic lymph nodes. Reproductive: Status post hysterectomy. No adnexal masses. Other: A trace amount of free fluid in the pelvis is noted. Mild subcutaneous edema is present. There is no evidence of focal collection or pneumoperitoneum. Musculoskeletal: No acute or suspicious bony abnormalities are present. IMPRESSION: 1. Moderate to large bilateral pleural effusions with mild to moderate bibasilar atelectasis, trace amount of free pelvic fluid and mild subcutaneous edema. 2. Mild ground-glass/interstitial opacities, greatest in the central RIGHT lung, which may represent mild pulmonary edema or less likely infection.  3. Cardiomegaly and coronary artery disease. 4. Aortic Atherosclerosis (ICD10-I70.0). Electronically Signed   By: Margarette Canada M.D.   On: 10/29/2019 13:08   DG Chest Portable 1 View  Result Date: 10/29/2019 CLINICAL DATA:  Weakness. EXAM: PORTABLE CHEST 1 VIEW COMPARISON:  October 16, 2019. FINDINGS: Stable cardiomediastinal silhouette. No pneumothorax is noted. Increased bibasilar opacities are noted concerning for edema with probable small bilateral pleural effusions. Bony thorax is unremarkable. IMPRESSION: Increased bibasilar opacities are noted concerning for edema with probable small bilateral pleural effusions. Aortic Atherosclerosis (ICD10-I70.0). Electronically Signed   By: Marijo Conception M.D.   On: 10/26/2019 11:52    ____________________________________________   PROCEDURES and INTERVENTIONS  Procedure(s) performed (including Critical Care):  .1-3 Lead EKG Interpretation Performed by: Vladimir Crofts, MD Authorized by: Vladimir Crofts, MD     Interpretation: abnormal     ECG rate:  100   ECG rate assessment: tachycardic     Rhythm: atrial fibrillation     Ectopy: none     Conduction: normal   .Critical Care Performed by: Vladimir Crofts, MD Authorized by: Vladimir Crofts, MD   Critical care provider statement:    Critical care time (minutes):  45   Critical care was necessary to treat or prevent imminent or life-threatening deterioration of the following conditions:  Metabolic crisis, sepsis and circulatory failure   Critical care was time spent personally by me on the following activities:  Discussions with consultants, evaluation of patient's  response to treatment, examination of patient, ordering and performing treatments and interventions, ordering and review of laboratory studies, ordering and review of radiographic studies, pulse oximetry, re-evaluation of patient's condition, obtaining history from patient or surrogate and review of old charts    Medications  ceFEPIme  (MAXIPIME) 1 g in sodium chloride 0.9 % 100 mL IVPB (0 g Intravenous Stopped 11/14/2019 1303)  vancomycin (VANCOCIN) IVPB 1000 mg/200 mL premix (0 mg Intravenous Stopped 11/07/2019 1334)  0.9 %  sodium chloride infusion ( Intravenous New Bag/Given 10/24/2019 1216)    ____________________________________________   MDM / ED COURSE  86 year old woman DNR status presents from home with generalized weakness, most consistent with severe sepsis requiring medical admission.  Patient with core temperature of 91 degrees on presentation, not hypoxic on room air and remained hemodynamically stable.  She is intermittently going in and out of A. fib with RVR with rates settling out to the 90s-100s without need for pressors or rate control at this time.  Her exam is nonfocal without evidence of significant trauma or nidus of infection.  She has bibasilar crackles that are faint in her abdomen is without peritoneal features.  Blood work demonstrates dramatic lactic acidosis greater than 10.  Blood and urine cultures were drawn, indwelling temperature-sensing Foley was left in place.  Patient was provided broad-spectrum antibiotics.  CT imaging without contrast, due to her relative renal failure and poor GFR, demonstrates pleural effusions and possible pneumonia as the source.  Still awaiting urine testing, but she has put out very little urine through her catheter.  Regardless, we will treat her with antibiotics to cover UTI or pneumonia as the etiology of her severe sepsis.  Will admit the patient to hospitalist medicine for further work-up and management.  Clinical Course as of Nov 11 1339  Sat Nov 11, 2019  1221 Notified of lactic of 10.  Cefepime is going in now.  About to go to CT, added on chest abdomen pelvis without contrast to assess for local source of infection such as abscess.  Temp sensing Foley in place, warmed fluids ongoing.  Bear hugger in place.   [DS]  8185 Daughter at the bedside.  Confirms DNR status.   Educated daughter of patient's severe illness.  Answered questions.   [DS]  1309 Temperature improving.  94 degrees.  Heart rate 90s/low 100s in A. fib.  No indications for rate control at this time.  Awaiting CT reads   [DS]  39 Educated daughter at the bedside of the high severity of illness   [DS]    Clinical Course User Index [DS] Vladimir Crofts, MD     ____________________________________________   FINAL CLINICAL IMPRESSION(S) / ED DIAGNOSES  Final diagnoses:  Hypothermia, initial encounter  AKI (acute kidney injury) ([place])  Sepsis, due to unspecified organism, unspecified whether acute organ dysfunction present [facility])  Atrial fibrillation with RVR [facility])     ED Discharge Orders    None       Tanijah Morais   Note:  This document was prepared using Dragon voice recognition software and may include unintentional dictation errors.   Vladimir Crofts, MD 11/18/2019 1343

## 2019-11-11 NOTE — ED Notes (Signed)
Tried to call report to ICU. Person spoke with said that they had "never accepted this pt". This nurse asked them to have their CN call ED CN. She said that bed placement took the assigned bed back.

## 2019-11-11 NOTE — Consult Note (Addendum)
Pharmacy Antibiotic Note  Kristin Rowe is a 86 y.o. female admitted on 11/07/2019 with HCAP. Pt presented from home with weakness. Pt returned to home from postacute rehab 2 weeks ago following hospital visit for pyelonephritis on 9/23. PMH includes PVD, carotid stenosis s/p right endarterectomy, cervical cancer s/p radiation, HTN, GERD, and anxiety. Pharmacy has been consulted for vancomycin and cefepime dosing.  Plan: Vancomycin 750 mg  IV every 48 hours.  Goal trough 15-20 mcg/mL. Cefepime 2 mg q24h  Monitor renal function and adjust as needed  Height: 5\' 4"  (162.6 cm) Weight: 54.4 kg (120 lb) IBW/kg (Calculated) : 54.7  Temp (24hrs), Avg:93.2 F (34 C), Min:91.1 F (32.8 C), Max:94.4 F (34.7 C)  Recent Labs  Lab 10/27/2019 1129 10/30/2019 1338  WBC 6.3  --   CREATININE 1.70*  --   LATICACIDVEN 10.3* 4.2*    Estimated Creatinine Clearance: 18.5 mL/min (A) (by C-G formula based on SCr of 1.7 mg/dL (H)).    Allergies  Allergen Reactions  . Daypro [Oxaprozin] Other (See Comments)    gi  . Morphine And Related Nausea And Vomiting  . Plaquenil [Hydroxychloroquine Sulfate]     Pt denies  . Ridaura [Auranofin] Other (See Comments)    denies    Antimicrobials this admission: 10/23 cefepime >>  10/23 vancomycin >>   Microbiology results: 10/23 BCx: pending 10/23 UCx: pending  Thank you for allowing pharmacy to be a part of this patient's care.  Benn Moulder, PharmD Pharmacy Resident  10/22/2019 2:54 PM

## 2019-11-11 NOTE — ED Notes (Signed)
Date and time results received: 10/29/2019 1220   Test: lactic acid Critical Value: 10.3  Name of Provider Notified: Dr. Tamala Julian

## 2019-11-11 NOTE — ED Notes (Signed)
Bear hugger put on pt.

## 2019-11-11 NOTE — H&P (Addendum)
History and Physical    [name] NUU:[id number] DOB: [birth year] DOA: 11/18/2019  Referring MD/NP/PA:   PCP: [name], MD   Patient coming from:  The patient is coming from home.  At baseline, pt is partially dependent for most of ADL.        Chief Complaint: Shortness of breath and generalized weakness  HPI: Kristin Rowe is a 86 y.o. female with medical history significant of hypertension, COPD, GERD, depression, anxiety, PVD, vaginal cancer (s/p of radiation therapy, vaginectomy, hysterectomy), A. fib not on anticoagulants, hyperthyroidism, carotid artery stenosis (s/p for endarterectomy), recent treatment for pyelonephritis, CKD-3, who presents with shortness of breath and generalized weakness.  Per patient's daughter, patient has been sick for about 1 week.  She has generalized weakness.  Patient has shortness breath and chest tightness, but no chest pain.  No fever or chills.  Patient nausea, vomited once early morning.  No diarrhea or abdominal pain.  No symptoms of UTI.  No unilateral numbness or tingling symptoms history no facial droop or slurred speech.  Patient fell few days ago without significant injury.  Patient initially had hypotension with blood pressure 88/73, which improved to 107/82 after giving 1 L normal saline bolus in ED.  ED Course: pt was found to have WBC 6.3, lactic acid 10.3, troponin level 16, negative Covid PCR, worsening renal function, abnormal liver function (ALP 84, AST 104, AST 53, total bilirubin 1.6), temperature 91.1, 92.7 ED, tachycardia with heart rate up to 130, tachypnea with RR 25, oxygen saturation 94% on room air.  CT head negative for acute intracranial abnormalities.  Patient is admitted to stepdown as inpatient.  CT-abd/pelivs and chest: 1. Moderate to large bilateral pleural effusions with mild to moderate bibasilar atelectasis, trace amount of free pelvic fluid and mild subcutaneous edema. 2. Mild ground-glass/interstitial  opacities, greatest in the central RIGHT lung, which may represent mild pulmonary edema or less likely infection. 3. Cardiomegaly and coronary artery disease. 4. Aortic Atherosclerosis (ICD10-I70.0).  Review of Systems:   General: no fevers, chills, no body weight gain, has poor appetite, has fatigue HEENT: no blurry vision, hearing changes or sore throat Respiratory: has dyspnea, no coughing, wheezing CV: no chest pain, no palpitations GI: had nausea, vomiting, no abdominal pain, diarrhea, constipation GU: no dysuria, burning on urination, increased urinary frequency, hematuria  Ext: no leg edema Neuro: no unilateral weakness, numbness, or tingling, no vision change or hearing loss Skin: no rash, no skin tear. MSK: No muscle spasm, no deformity, no limitation of range of movement in spin Heme: No easy bruising.  Travel history: No recent long distant travel.  Allergy:  Allergies  Allergen Reactions  . Daypro [Oxaprozin] Other (See Comments)    gi  . Morphine And Related Nausea And Vomiting  . Plaquenil [Hydroxychloroquine Sulfate]     Pt denies  . Ridaura [Auranofin] Other (See Comments)    denies    Past Medical History:  Diagnosis Date  . Anxiety   . Arthritis   . Back pain    chronic  . Cancer [facility])    Vaginal - radiation treatments/mohs for melanoma left eyelid  . COPD (chronic obstructive pulmonary disease) ([place])   . Dizziness    in am  . Heart murmur   . Hypertension   . Hyperthyroidism   . Peripheral vascular disease [facility])     Past Surgical History:  Procedure Laterality Date  . ABDOMINAL HYSTERECTOMY    . ENDARTERECTOMY Right 10/17/2015   Procedure:  ENDARTERECTOMY CAROTID;  Surgeon: Algernon Huxley, MD;  Location: [facility] ORS;  Service: Vascular;  Laterality: Right;  . MOHS SURGERY    . vaginectomy     partial    Social History:  reports that she quit smoking about 34 years ago. She has never used smokeless tobacco. She reports that she does not drink  alcohol and does not use drugs.  Family History:  Family History  Problem Relation Age of Onset  . Kidney disease Sister   . Kidney Stones Sister   . Kidney cancer Neg Hx   . Prostate cancer Neg Hx      Prior to Admission medications   Medication Sig Start Date End Date Taking? Authorizing Provider  ALPRAZolam (XANAX) 0.25 MG tablet Take 0.25 mg by mouth 3 (three) times daily as needed for anxiety.    [provider]  aspirin EC 81 MG tablet Take 81 mg every other day by mouth.     [provider]  Clobetasol Prop Emollient Base (CLOBETASOL PROPIONATE E) 0.05 % emollient cream Apply 1 application topically 2 (two) times daily. 08/08/19   Zara Council A, PA-C  COD LIVER OIL PO Take by mouth. Every other day    [provider]  conjugated estrogens (PREMARIN) vaginal cream Place 1 Applicatorful vaginally daily. Apply 0.5mg  (pea-sized amount)  just inside the vaginal introitus with a finger-tip on  Monday, Wednesday and Friday nights. 08/08/19   Zara Council A, PA-C  Lidocaine 2 % GEL Apply 1 Act topically as needed. 08/30/19   Zara Council A, PA-C  methimazole (TAPAZOLE) 5 MG tablet Take 5 mg by mouth daily.    [provider]  metoprolol (LOPRESSOR) 100 MG tablet Take 100 mg by mouth 2 (two) times daily.     [provider]  mirtazapine (REMERON) 15 MG tablet Take 15 mg by mouth at bedtime.  01/25/18 10/12/19  [provider]  pantoprazole (PROTONIX) 40 MG tablet Take 40 mg by mouth daily. 08/25/19   [provider]  sodium chloride (OCEAN) 0.65 % SOLN nasal spray Place 1 spray into both nostrils as needed for congestion. 10/17/19   Sidney Ace, MD    Physical Exam: Vitals:   11/16/2019 1657 11/19/2019 1700 11/14/2019 1730 11/07/2019 1800  BP:  105/71 102/69   Pulse: 89 80    Resp:    (!) 22  Temp: (!) 95.4 F (35.2 C) (!) 95.4 F (35.2 C) (!) 95.8 F (35.4 C)   TempSrc:      SpO2: 91% 92%    Weight:      Height:        General: Not in acute distress HEENT:       Eyes: PERRL, EOMI, no scleral icterus.       ENT: No discharge from the ears and nose, no pharynx injection, no tonsillar enlargement.        Neck: No JVD, no bruit, no mass felt. Heme: No neck lymph node enlargement. Cardiac: S1/S2, RRR, No gallops or rubs. Respiratory: Decreased air movement bilaterally. GI: Soft, nondistended, nontender, no rebound pain, no organomegaly, BS present. GU: No hematuria Ext: No pitting leg edema bilaterally. 1+DP/PT pulse bilaterally. Musculoskeletal: No joint deformities, No joint redness or warmth, no limitation of ROM in spin. Skin: No rashes.  Neuro: Alert, oriented X3, cranial nerves II-XII grossly intact, moves all extremities normally. Psych: Patient is not psychotic, no suicidal or hemocidal ideation.  Labs on Admission: I have personally reviewed following labs  and imaging studies  CBC: Recent Labs  Lab 11/19/2019 1129  WBC 6.3  NEUTROABS 4.8  HGB 10.2*  HCT 35.5*  MCV 99.2  PLT 240*   Basic Metabolic Panel: Recent Labs  Lab 10/28/2019 1129  NA 138  K 4.6  CL 99  CO2 20*  GLUCOSE 129*  BUN 30*  CREATININE 1.70*  CALCIUM 8.9  MG 2.1   GFR: Estimated Creatinine Clearance: 18.5 mL/min (A) (by C-G formula based on SCr of 1.7 mg/dL (H)). Liver Function Tests: Recent Labs  Lab 10/27/2019 1129  AST 104*  ALT 53*  ALKPHOS 84  BILITOT 1.6*  PROT 7.7  ALBUMIN 3.5   No results for input(s): LIPASE, AMYLASE in the last 168 hours. No results for input(s): AMMONIA in the last 168 hours. Coagulation Profile: Recent Labs  Lab 11/07/2019 1705  INR 1.9*   Cardiac Enzymes: No results for input(s): CKTOTAL, CKMB, CKMBINDEX, TROPONINI in the last 168 hours. BNP (last 3 results) No results for input(s): PROBNP in the last 8760 hours. HbA1C: No results for input(s): HGBA1C in the last 72 hours. CBG: No results for input(s): GLUCAP in the last 168 hours. Lipid Profile: No results for  input(s): CHOL, HDL, LDLCALC, TRIG, CHOLHDL, LDLDIRECT in the last 72 hours. Thyroid Function Tests: No results for input(s): TSH, T4TOTAL, FREET4, T3FREE, THYROIDAB in the last 72 hours. Anemia Panel: No results for input(s): VITAMINB12, FOLATE, FERRITIN, TIBC, IRON, RETICCTPCT in the last 72 hours. Urine analysis:    Component Value Date/Time   COLORURINE AMBER (A) 10/20/2019 1705   APPEARANCEUR CLOUDY (A) 11/04/2019 1705   APPEARANCEUR Clear 12/24/2016 1350   LABSPEC 1.024 11/04/2019 1705   PHURINE 5.0 10/27/2019 1705   GLUCOSEU 50 (A) 11/12/2019 1705   HGBUR LARGE (A) 11/18/2019 1705   BILIRUBINUR NEGATIVE 11/02/2019 1705   BILIRUBINUR Negative 12/24/2016 1350   KETONESUR NEGATIVE 11/07/2019 1705   PROTEINUR >=300 (A) 11/10/2019 1705   NITRITE NEGATIVE 10/30/2019 1705   LEUKOCYTESUR NEGATIVE 10/24/2019 1705   Sepsis Labs: @LABRCNTIP (procalcitonin:4,lacticidven:4) ) Recent Results (from the past 240 hour(s))  Respiratory Panel by RT PCR (Flu A&B, Covid) - Nasopharyngeal Swab     Status: None   Collection Time: 11/05/2019 11:29 AM   Specimen: Nasopharyngeal Swab  Result Value Ref Range Status   SARS Coronavirus 2 by RT PCR NEGATIVE NEGATIVE Final    Comment: (NOTE) SARS-CoV-2 target nucleic acids are NOT DETECTED.  The SARS-CoV-2 RNA is generally detectable in upper respiratoy specimens during the acute phase of infection. The lowest concentration of SARS-CoV-2 viral copies this assay can detect is 131 copies/mL. A negative result does not preclude SARS-Cov-2 infection and should not be used as the sole basis for treatment or other patient management decisions. A negative result may occur with  improper specimen collection/handling, submission of specimen other than nasopharyngeal swab, presence of viral mutation(s) within the areas targeted by this assay, and inadequate number of viral copies (<131 copies/mL). A negative result must be combined with clinical observations,  patient history, and epidemiological information. The expected result is Negative.  Fact Sheet for Patients:  PinkCheek.be  Fact Sheet for Healthcare Providers:  GravelBags.it  This test is no t yet approved or cleared by the [place] FDA and  has been authorized for detection and/or diagnosis of SARS-CoV-2 by FDA under an Emergency Use Authorization (EUA). This EUA will remain  in effect (meaning this test can be used) for the duration of the COVID-19 declaration under Section 564(b)(1) of the  Act, 21 U.S.C. section 360bbb-3(b)(1), unless the authorization is terminated or revoked sooner.     Influenza A by PCR NEGATIVE NEGATIVE Final   Influenza B by PCR NEGATIVE NEGATIVE Final    Comment: (NOTE) The Xpert Xpress SARS-CoV-2/FLU/RSV assay is intended as an aid in  the diagnosis of influenza from Nasopharyngeal swab specimens and  should not be used as a sole basis for treatment. Nasal washings and  aspirates are unacceptable for Xpert Xpress SARS-CoV-2/FLU/RSV  testing.  Fact Sheet for Patients: PinkCheek.be  Fact Sheet for Healthcare Providers: GravelBags.it  This test is not yet approved or cleared by the [place] FDA and  has been authorized for detection and/or diagnosis of SARS-CoV-2 by  FDA under an Emergency Use Authorization (EUA). This EUA will remain  in effect (meaning this test can be used) for the duration of the  Covid-19 declaration under Section 564(b)(1) of the Act, 21  U.S.C. section 360bbb-3(b)(1), unless the authorization is  terminated or revoked. Performed at [facility], [address] [place], [place] [address]      Radiological Exams on Admission: CT ABDOMEN PELVIS WO CONTRAST  Result Date: 11/15/2019 CLINICAL DATA:  86 year old female with shortness of breath, chest and abdominal pain, and bibasilar  opacities on recent chest radiograph. EXAM: CT CHEST, ABDOMEN AND PELVIS WITHOUT CONTRAST TECHNIQUE: Multidetector CT imaging of the chest, abdomen and pelvis was performed following the standard protocol without IV contrast. COMPARISON:  10/12/2019 abdomen/pelvic CT and 01/04/2009 PET CT FINDINGS: Please note that parenchymal and vascular abnormalities may be missed without intravenous contrast. CT CHEST FINDINGS Cardiovascular: Cardiomegaly and heavy coronary artery and aortic atherosclerotic calcifications are again noted. There is no evidence of thoracic aortic aneurysm or pericardial effusion. Mediastinum/Nodes: An enlarged LEFT thyroid gland is unchanged. No abnormal appearing lymph nodes are present. No mediastinal mass or hematoma identified. Lungs/Pleura: Moderate to large bilateral pleural effusions are present with mild to moderate bibasilar atelectasis. Mild ground-glass/interstitial opacities are present, greatest in the central RIGHT lung centrally. Mild biapical pleural-parenchymal scarring again not no pneumothorax noted. Musculoskeletal: Sclerosis within the T10 vertebral body appears to be reactive from adjacent Schmorl's nodes. CT ABDOMEN PELVIS FINDINGS Hepatobiliary: No hepatic or gallbladder abnormalities are identified. No definite biliary dilatation. Pancreas: Unremarkable Spleen: Unremarkable Adrenals/Urinary Tract: The kidneys and adrenal glands are unremarkable. A Foley catheter is present within a collapsed bladder. Stomach/Bowel: Stomach is within normal limits. Appendix appears normal. No evidence of bowel wall thickening, distention, or inflammatory changes. Colonic diverticulosis noted without evidence of acute diverticulitis. Vascular/Lymphatic: Aortic atherosclerosis. No enlarged abdominal or pelvic lymph nodes. Reproductive: Status post hysterectomy. No adnexal masses. Other: A trace amount of free fluid in the pelvis is noted. Mild subcutaneous edema is present. There is no  evidence of focal collection or pneumoperitoneum. Musculoskeletal: No acute or suspicious bony abnormalities are present. IMPRESSION: 1. Moderate to large bilateral pleural effusions with mild to moderate bibasilar atelectasis, trace amount of free pelvic fluid and mild subcutaneous edema. 2. Mild ground-glass/interstitial opacities, greatest in the central RIGHT lung, which may represent mild pulmonary edema or less likely infection. 3. Cardiomegaly and coronary artery disease. 4. Aortic Atherosclerosis (ICD10-I70.0). Electronically Signed   By: Margarette Canada M.D.   On: 10/21/2019 13:08   CT Head Wo Contrast  Result Date: 11/12/2019 CLINICAL DATA:  Dizziness, altered mental status. EXAM: CT HEAD WITHOUT CONTRAST TECHNIQUE: Contiguous axial images were obtained from the base of the skull through the vertex without intravenous contrast. COMPARISON:  October 12, 2019. FINDINGS: Brain: No evidence of acute infarction, hemorrhage, hydrocephalus, extra-axial collection or mass lesion/mass effect. Vascular: No hyperdense vessel or unexpected calcification. Skull: Normal. Negative for fracture or focal lesion. Sinuses/Orbits: No acute finding. Other: None. IMPRESSION: Normal head CT. Electronically Signed   By: Marijo Conception M.D.   On: 11/12/2019 12:53   CT Chest Wo Contrast  Result Date: 10/25/2019 CLINICAL DATA:  86 year old female with shortness of breath, chest and abdominal pain, and bibasilar opacities on recent chest radiograph. EXAM: CT CHEST, ABDOMEN AND PELVIS WITHOUT CONTRAST TECHNIQUE: Multidetector CT imaging of the chest, abdomen and pelvis was performed following the standard protocol without IV contrast. COMPARISON:  10/12/2019 abdomen/pelvic CT and 01/04/2009 PET CT FINDINGS: Please note that parenchymal and vascular abnormalities may be missed without intravenous contrast. CT CHEST FINDINGS Cardiovascular: Cardiomegaly and heavy coronary artery and aortic atherosclerotic calcifications are  again noted. There is no evidence of thoracic aortic aneurysm or pericardial effusion. Mediastinum/Nodes: An enlarged LEFT thyroid gland is unchanged. No abnormal appearing lymph nodes are present. No mediastinal mass or hematoma identified. Lungs/Pleura: Moderate to large bilateral pleural effusions are present with mild to moderate bibasilar atelectasis. Mild ground-glass/interstitial opacities are present, greatest in the central RIGHT lung centrally. Mild biapical pleural-parenchymal scarring again not no pneumothorax noted. Musculoskeletal: Sclerosis within the T10 vertebral body appears to be reactive from adjacent Schmorl's nodes. CT ABDOMEN PELVIS FINDINGS Hepatobiliary: No hepatic or gallbladder abnormalities are identified. No definite biliary dilatation. Pancreas: Unremarkable Spleen: Unremarkable Adrenals/Urinary Tract: The kidneys and adrenal glands are unremarkable. A Foley catheter is present within a collapsed bladder. Stomach/Bowel: Stomach is within normal limits. Appendix appears normal. No evidence of bowel wall thickening, distention, or inflammatory changes. Colonic diverticulosis noted without evidence of acute diverticulitis. Vascular/Lymphatic: Aortic atherosclerosis. No enlarged abdominal or pelvic lymph nodes. Reproductive: Status post hysterectomy. No adnexal masses. Other: A trace amount of free fluid in the pelvis is noted. Mild subcutaneous edema is present. There is no evidence of focal collection or pneumoperitoneum. Musculoskeletal: No acute or suspicious bony abnormalities are present. IMPRESSION: 1. Moderate to large bilateral pleural effusions with mild to moderate bibasilar atelectasis, trace amount of free pelvic fluid and mild subcutaneous edema. 2. Mild ground-glass/interstitial opacities, greatest in the central RIGHT lung, which may represent mild pulmonary edema or less likely infection. 3. Cardiomegaly and coronary artery disease. 4. Aortic Atherosclerosis (ICD10-I70.0).  Electronically Signed   By: Margarette Canada M.D.   On: 10/24/2019 13:08   DG Chest Portable 1 View  Result Date: 10/29/2019 CLINICAL DATA:  Weakness. EXAM: PORTABLE CHEST 1 VIEW COMPARISON:  October 16, 2019. FINDINGS: Stable cardiomediastinal silhouette. No pneumothorax is noted. Increased bibasilar opacities are noted concerning for edema with probable small bilateral pleural effusions. Bony thorax is unremarkable. IMPRESSION: Increased bibasilar opacities are noted concerning for edema with probable small bilateral pleural effusions. Aortic Atherosclerosis (ICD10-I70.0). Electronically Signed   By: Marijo Conception M.D.   On: 10/22/2019 11:52     EKG: I have personally reviewed.  Very poor quality of EKG strips, QTC 448, seems to be A. fib   Assessment/Plan Principal Problem:   Severe sepsis with septic shock (HCC) Active Problems:   Essential hypertension   HCAP (healthcare-associated pneumonia)   Bilateral pleural effusion   Atrial fibrillation, chronic (HCC)   Acute renal failure superimposed on stage 3a chronic kidney disease (HCC)   Abnormal LFTs   Hypothermia   Hyperthyroidism   Depression   Severe sepsis with septic shock due to possible  HCAP: Patient has hypotension, tachycardia, tachypnea and hypothermia.  Lactic acid is up to 10.3.  Patient has bilateral pleural effusion, we need to rule out empyema.  - Will admit to SDU as inpt - IV Vancomycin and cefepime - Mucinex for cough  - Bronchodilators - Urine legionella and S. pneumococcal antigen - Follow up blood culture x2, sputum culture - will get Procalcitonin and trend lactic acid level per sepsis protocol - IVF: 1.5L of NS bolus in ED, followed by 75 mL per hour of NS - give 50 mg of Solu-Cortef  Hypothermia: Due to sepsis -Bair hugger  Essential hypertension -hold blood pressure medications due to hypotension and sepsis  Bilateral pleural effusion: -Dr. Vernard Gambles of IR is consulted for thoracentesis -f/u  pleural fluid analysis  Atrial fibrillation, chronic ([place]): Patient does not take anticoagulant.  Heart rate 60, 130, 100 -hold metoprolol due to hypotension  Acute renal failure superimposed on stage 3a chronic kidney disease ([place]): Baseline creatinine 0.79 on 10/12/2019.  Her creatinine is 1.70, BUN 30.  May be due to ATN secondary to hypotension -Avoid using renal toxic medications -IV fluid as above  Abnormal LFTs: May be due to sepsis. -Check hepatitis panel -Follow-up ultrasound-RUQ -Avoid using Tylenol  Hypothyroidism: -Continue methimazole  Depression: -Remeron    DVT ppx: SCD Code Status: DNR per her daughter and pt Family Communication:  Yes, patient's daughterat bed side Disposition Plan:  Anticipate discharge back to previous environment Consults called:  Dr. Vernard Gambles of IR Admission status:  SDU/inpation       Status is: Inpatient  Remains inpatient appropriate because:Inpatient level of care appropriate due to severity of illness  Dispo: The patient is from: Home              Anticipated d/c is to: Home              Anticipated d/c date is: 2 days              Patient currently is not medically stable to d/c.           Date of Service 11/02/2019    Ivor Costa [facility]   If 7PM-7AM, please contact night-coverage [email or web address] 11/14/2019, 6:22 PM

## 2019-11-11 NOTE — ED Notes (Signed)
Pt back from x-ray.

## 2019-11-11 NOTE — ED Notes (Signed)
X-ray at bedside

## 2019-11-11 NOTE — ED Triage Notes (Addendum)
Pt to ED via [facility] from home. Pt family called EMS because pt feels weak. EMS states oral temp was 91.0 F.Pt states she fell a few nights ago. Has abrasion on L cheek. [facility] states pt stated was having mild abdo pain. Pt denies abdo pain at this time. Pt alert and oriented to self, time, place, situation. EDP at bedside.

## 2019-11-11 NOTE — ED Notes (Signed)
Pt pulling off blanket and bear hugger stating "too hot". Explained importance of keeping on to raise core T.

## 2019-11-12 ENCOUNTER — Inpatient Hospital Stay: Payer: Medicare Other

## 2019-11-12 ENCOUNTER — Encounter: Payer: Self-pay | Admitting: Internal Medicine

## 2019-11-12 DIAGNOSIS — N179 Acute kidney failure, unspecified: Secondary | ICD-10-CM | POA: Diagnosis not present

## 2019-11-12 DIAGNOSIS — J90 Pleural effusion, not elsewhere classified: Secondary | ICD-10-CM | POA: Diagnosis not present

## 2019-11-12 DIAGNOSIS — A419 Sepsis, unspecified organism: Secondary | ICD-10-CM | POA: Diagnosis not present

## 2019-11-12 DIAGNOSIS — I482 Chronic atrial fibrillation, unspecified: Secondary | ICD-10-CM | POA: Diagnosis not present

## 2019-11-12 LAB — CBC
HCT: 31.9 % — ABNORMAL LOW (ref 36.0–46.0)
Hemoglobin: 9.3 g/dL — ABNORMAL LOW (ref 12.0–15.0)
MCH: 28.6 pg (ref 26.0–34.0)
MCHC: 29.2 g/dL — ABNORMAL LOW (ref 30.0–36.0)
MCV: 98.2 fL (ref 80.0–100.0)
Platelets: 127 10*3/uL — ABNORMAL LOW (ref 150–400)
RBC: 3.25 MIL/uL — ABNORMAL LOW (ref 3.87–5.11)
RDW: 19.2 % — ABNORMAL HIGH (ref 11.5–15.5)
WBC: 13.8 10*3/uL — ABNORMAL HIGH (ref 4.0–10.5)
nRBC: 0 % (ref 0.0–0.2)

## 2019-11-12 LAB — BLOOD CULTURE ID PANEL (REFLEXED) - BCID2

## 2019-11-12 LAB — BASIC METABOLIC PANEL
Anion gap: 16 — ABNORMAL HIGH (ref 5–15)
BUN: 36 mg/dL — ABNORMAL HIGH (ref 8–23)
CO2: 18 mmol/L — ABNORMAL LOW (ref 22–32)
Calcium: 8 mg/dL — ABNORMAL LOW (ref 8.9–10.3)
Chloride: 104 mmol/L (ref 98–111)
Creatinine, Ser: 2.06 mg/dL — ABNORMAL HIGH (ref 0.44–1.00)
GFR, Estimated: 22 mL/min — ABNORMAL LOW (ref >60)
Glucose, Bld: 93 mg/dL (ref 70–99)
Potassium: 4.9 mmol/L (ref 3.5–5.1)
Sodium: 138 mmol/L (ref 135–145)

## 2019-11-12 LAB — MRSA PCR SCREENING: MRSA by PCR: NEGATIVE

## 2019-11-12 LAB — BODY FLUID CELL COUNT WITH DIFFERENTIAL
Eos, Fluid: 0 %
Lymphs, Fluid: 85 %
Monocyte-Macrophage-Serous Fluid: 11 %
Neutrophil Count, Fluid: 4 %
Total Nucleated Cell Count, Fluid: 259 cu mm

## 2019-11-12 LAB — GLUCOSE, PLEURAL OR PERITONEAL FLUID: Glucose, Fluid: 108 mg/dL

## 2019-11-12 LAB — ALBUMIN, PLEURAL OR PERITONEAL FLUID: Albumin, Fluid: 1 g/dL

## 2019-11-12 LAB — GLUCOSE, CAPILLARY: Glucose-Capillary: 100 mg/dL — ABNORMAL HIGH (ref 70–99)

## 2019-11-12 LAB — PROCALCITONIN: Procalcitonin: 0.17 ng/mL

## 2019-11-12 LAB — LACTATE DEHYDROGENASE, PLEURAL OR PERITONEAL FLUID: LD, Fluid: 61 U/L — ABNORMAL HIGH (ref 3–23)

## 2019-11-12 LAB — STREP PNEUMONIAE URINARY ANTIGEN: Strep Pneumo Urinary Antigen: NEGATIVE

## 2019-11-12 LAB — PROTEIN, PLEURAL OR PERITONEAL FLUID: Total protein, fluid: <3 g/dL

## 2019-11-12 LAB — LACTIC ACID, PLASMA: Lactic Acid, Venous: 7.6 mmol/L (ref 0.5–1.9)

## 2019-11-12 LAB — BRAIN NATRIURETIC PEPTIDE: B Natriuretic Peptide: 561.2 pg/mL — ABNORMAL HIGH (ref 0.0–100.0)

## 2019-11-12 MED ORDER — LACTULOSE 10 GM/15ML PO SOLN: 20.0000 g | Freq: Once | ORAL | Status: DC

## 2019-11-12 MED ORDER — SODIUM CHLORIDE 0.9 % IV SOLN
3.0000 g | Freq: Two times a day (BID) | INTRAVENOUS | Status: DC
  Administered 2019-11-12 – 2019-11-13 (×2): 3 g via INTRAVENOUS
  Filled 2019-11-12: qty 8
  Filled 2019-11-12: qty 3
  Filled 2019-11-12 (×2): qty 8

## 2019-11-12 MED ORDER — CHLORHEXIDINE GLUCONATE CLOTH 2 % EX PADS
6.0000 | MEDICATED_PAD | Freq: Every day | CUTANEOUS | Status: DC
  Administered 2019-11-12 – 2019-11-13 (×2): 6 via TOPICAL

## 2019-11-12 MED ORDER — SENNOSIDES-DOCUSATE SODIUM 8.6-50 MG PO TABS
2.0000 | ORAL_TABLET | Freq: Two times a day (BID) | ORAL | Status: DC
  Administered 2019-11-12 – 2019-11-13 (×3): 2 via ORAL
  Filled 2019-11-12 (×3): qty 2

## 2019-11-12 NOTE — Progress Notes (Addendum)
PROGRESS NOTE    Kristin Rowe  WHQ:[id number] DOB: 11-05-1926 DOA: 11/05/2019 PCP: [name], MD   Chief complaint. Shortness of breath. Brief Narrative:   Kristin Rowe is a 86 y.o. female with medical history significant of hypertension, COPD, GERD, depression, anxiety, PVD, vaginal cancer (s/p of radiation therapy, vaginectomy, hysterectomy), A. fib not on anticoagulants, hyperthyroidism, carotid artery stenosis (s/p for endarterectomy), recent treatment for pyelonephritis, CKD-3, who presents with shortness of breath and generalized weakness. In the emergency room, patient had a tachycardia of 130, tachypnea. Lactic acid level 10.3. No leukocytosis. CT abdomen/pelvis and chest showed bilateral pleural effusion, mild groundglass interstitial opacities in the bilateral lower lobe. She is giving IV fluids, she also started on broad-spectrum antibiotics with vancomycin and cefepime.  10/24. Thoracentesis scheduled for today, procalcitonin level 0.17. Not consistent with infection. Obtain palliative care consult.  Assessment & Plan:   Principal Problem:   Severe sepsis with septic shock (HCC) Active Problems:   Essential hypertension   HCAP (healthcare-associated pneumonia)   Bilateral pleural effusion   Atrial fibrillation, chronic (HCC)   Acute renal failure superimposed on stage 3a chronic kidney disease (HCC)   Abnormal LFTs   Hypothermia   Hyperthyroidism   Depression  #1. Severe lactic acidosis with SIRS. Not certain patient has infection. Pending culture results. Continue current antibiotics for now. Will review thoracentesis test results to determine if there is any infection. Continue IV fluids for now. Etiology of severe lactic acidosis is still unclear. Although CT scan abdomen/pelvis does not show significant abdominal abnormality, ischemic bowel is not ruled out clinically. Discussed with patient daughter, will take conservative approach in managing her  condition. Another consideration is malignancy, cytology is sent out from thoracentesis. Another possibility is a cardiogenic shock which caused poor tissue perfusion. Check a stat BNP. Echocardiogram a month ago showed ejection fraction 55%. Overall, patient has poor prognosis. She has dementia, old age, high lactic acid level not compatible with life. We will obtain palliative consult. If there is any diagnosis of malignancy or ischemic bowel, patient will be inpatient hospice candidate.  #2. Bilateral pleural effusion. As above, thoracentesis today.  3. Permanent atrial fibrillation with rapid ventricle response. Not a candidate for anticoagulation.  4. Acute kidney failure on chronic kidney disease stage IIIa. Renal function worsened today, making ischemic about a likely diagnosis.  5. Essential hypertension plan Hold off all blood pressure medicines.  6. Hypothermia. Due to poor tissue perfusion.   7. Liver function changes. Secondary to poor tissue perfusion.  Pleural fluid studies showed transudate, no evidence of infection.  Discontinue vancomycin for now.  DVT prophylaxis: SCDs Code Status: DNR Family Communication: Updated daughter Disposition Plan:  .   Status is: Inpatient  Remains inpatient appropriate because:Inpatient level of care appropriate due to severity of illness   Dispo: The patient is from: Home              Anticipated d/c is to: Uncertain              Anticipated d/c date is: 3 days              Patient currently is not medically stable to d/c.        I/O last 3 completed shifts: In: 2825.8 [I.V.:2025.8; IV Piggyback:800] Out: -  No intake/output data recorded.     Consultants:   None  Procedures: Thoracentesis.  Antimicrobials: Cefepime and vancomycin.  Subjective: Patient is confused, does not complaining of short of breath  or cough. She denies any pain in the stomach, no nausea vomiting. Temperature is slightly better, no  chills. She has no dysuria hematuria.   Objective: Vitals:   10/23/2019 2144 11/05/2019 2200 11/03/2019 2215 11/12/19 0141  BP:  111/89  (!) 109/91  Pulse:   (!) 111 (!) 104  Resp:  (!) 30  (!) 21  Temp:  (!) 97.2 F (36.2 C) (!) 97.2 F (36.2 C) (!) 96.5 F (35.8 C)  TempSrc:    Bladder  SpO2: 96% 98% 98% 92%  Weight:      Height:        Intake/Output Summary (Last 24 hours) at 11/12/2019 0900 Last data filed at 11/12/2019 0600 Gross per 24 hour  Intake 2825.82 ml  Output --  Net 2825.82 ml   Filed Weights   10/20/2019 1122  Weight: 54.4 kg    Examination:  General exam: Appears calm and comfortable, chronically ill. Respiratory system: Decreased breathing sounds. Respiratory effort normal. Cardiovascular system: Irregular no JVD, murmurs, rubs, gallops or clicks. No pedal edema. Gastrointestinal system: Abdomen is nondistended, soft and left sided tenderness. No rebound tenderness Normal bowel sounds heard. Central nervous system: Drowsy and confused. No focal neurological deficits. Extremities: Symmetric  Skin: No rashes, lesions or ulcers     Data Reviewed: I have personally reviewed following labs and imaging studies  CBC: Recent Labs  Lab 10/28/2019 1129 11/12/19 0535  WBC 6.3 13.8*  NEUTROABS 4.8  --   HGB 10.2* 9.3*  HCT 35.5* 31.9*  MCV 99.2 98.2  PLT 144* 297*   Basic Metabolic Panel: Recent Labs  Lab 11/17/2019 1129 11/12/19 0535  NA 138 138  K 4.6 4.9  CL 99 104  CO2 20* 18*  GLUCOSE 129* 93  BUN 30* 36*  CREATININE 1.70* 2.06*  CALCIUM 8.9 8.0*  MG 2.1  --    GFR: Estimated Creatinine Clearance: 15.3 mL/min (A) (by C-G formula based on SCr of 2.06 mg/dL (H)). Liver Function Tests: Recent Labs  Lab 10/26/2019 1129  AST 104*  ALT 53*  ALKPHOS 84  BILITOT 1.6*  PROT 7.7  ALBUMIN 3.5   No results for input(s): LIPASE, AMYLASE in the last 168 hours. No results for input(s): AMMONIA in the last 168 hours. Coagulation Profile: Recent  Labs  Lab 11/12/2019 1705  INR 1.9*   Cardiac Enzymes: No results for input(s): CKTOTAL, CKMB, CKMBINDEX, TROPONINI in the last 168 hours. BNP (last 3 results) No results for input(s): PROBNP in the last 8760 hours. HbA1C: No results for input(s): HGBA1C in the last 72 hours. CBG: No results for input(s): GLUCAP in the last 168 hours. Lipid Profile: No results for input(s): CHOL, HDL, LDLCALC, TRIG, CHOLHDL, LDLDIRECT in the last 72 hours. Thyroid Function Tests: No results for input(s): TSH, T4TOTAL, FREET4, T3FREE, THYROIDAB in the last 72 hours. Anemia Panel: No results for input(s): VITAMINB12, FOLATE, FERRITIN, TIBC, IRON, RETICCTPCT in the last 72 hours. Sepsis Labs: Recent Labs  Lab 11/02/2019 1338 11/03/2019 1705 11/15/2019 2028 11/12/19 0217 11/12/19 0535  PROCALCITON  --  <0.10  --   --  0.17  LATICACIDVEN 4.2* 6.3*  5.2* 4.7* 7.6*  --     Recent Results (from the past 240 hour(s))  Blood culture (routine x 2)     Status: None (Preliminary result)   Collection Time: 11/19/2019 11:24 AM   Specimen: BLOOD  Result Value Ref Range Status   Specimen Description BLOOD BLOOD RIGHT FOREARM  Final   Special Requests  Final    BOTTLES DRAWN AEROBIC AND ANAEROBIC Blood Culture adequate volume   Culture   Final    NO GROWTH < 24 HOURS Performed at [facility], [address] [place], [place] [address]    Report Status PENDING  Incomplete  Respiratory Panel by RT PCR (Flu A&B, Covid) - Nasopharyngeal Swab     Status: None   Collection Time: 10/21/2019 11:29 AM   Specimen: Nasopharyngeal Swab  Result Value Ref Range Status   SARS Coronavirus 2 by RT PCR NEGATIVE NEGATIVE Final    Comment: (NOTE) SARS-CoV-2 target nucleic acids are NOT DETECTED.  The SARS-CoV-2 RNA is generally detectable in upper respiratoy specimens during the acute phase of infection. The lowest concentration of SARS-CoV-2 viral copies this assay can detect is 131 copies/mL. A negative result  does not preclude SARS-Cov-2 infection and should not be used as the sole basis for treatment or other patient management decisions. A negative result may occur with  improper specimen collection/handling, submission of specimen other than nasopharyngeal swab, presence of viral mutation(s) within the areas targeted by this assay, and inadequate number of viral copies (<131 copies/mL). A negative result must be combined with clinical observations, patient history, and epidemiological information. The expected result is Negative.  Fact Sheet for Patients:  PinkCheek.be  Fact Sheet for Healthcare Providers:  GravelBags.it  This test is no t yet approved or cleared by the [place] FDA and  has been authorized for detection and/or diagnosis of SARS-CoV-2 by FDA under an Emergency Use Authorization (EUA). This EUA will remain  in effect (meaning this test can be used) for the duration of the COVID-19 declaration under Section 564(b)(1) of the Act, 21 U.S.C. section 360bbb-3(b)(1), unless the authorization is terminated or revoked sooner.     Influenza A by PCR NEGATIVE NEGATIVE Final   Influenza B by PCR NEGATIVE NEGATIVE Final    Comment: (NOTE) The Xpert Xpress SARS-CoV-2/FLU/RSV assay is intended as an aid in  the diagnosis of influenza from Nasopharyngeal swab specimens and  should not be used as a sole basis for treatment. Nasal washings and  aspirates are unacceptable for Xpert Xpress SARS-CoV-2/FLU/RSV  testing.  Fact Sheet for Patients: PinkCheek.be  Fact Sheet for Healthcare Providers: GravelBags.it  This test is not yet approved or cleared by the [place] FDA and  has been authorized for detection and/or diagnosis of SARS-CoV-2 by  FDA under an Emergency Use Authorization (EUA). This EUA will remain  in effect (meaning this test can be used) for  the duration of the  Covid-19 declaration under Section 564(b)(1) of the Act, 21  U.S.C. section 360bbb-3(b)(1), unless the authorization is  terminated or revoked. Performed at [facility], [address] [place], [place] [address]   Blood culture (routine x 2)     Status: None (Preliminary result)   Collection Time: 11/02/2019 11:30 AM   Specimen: BLOOD  Result Value Ref Range Status   Specimen Description BLOOD LEFT ASSIST CONTROL  Final   Special Requests   Final    BOTTLES DRAWN AEROBIC AND ANAEROBIC Blood Culture adequate volume   Culture   Final    NO GROWTH < 24 HOURS Performed at [facility], [address], [place], [place] [address]    Report Status PENDING  Incomplete  MRSA PCR Screening     Status: None   Collection Time: 11/12/19  3:15 AM   Specimen: Nasopharyngeal  Result Value Ref Range Status   MRSA  by PCR NEGATIVE NEGATIVE Final    Comment:        The GeneXpert MRSA Assay (FDA approved for NASAL specimens only), is one component of a comprehensive MRSA colonization surveillance program. It is not intended to diagnose MRSA infection nor to guide or monitor treatment for MRSA infections. Performed at [facility], [address], [place], [place] [address]          Radiology Studies: CT ABDOMEN PELVIS WO CONTRAST  Result Date: 10/20/2019 CLINICAL DATA:  86 year old female with shortness of breath, chest and abdominal pain, and bibasilar opacities on recent chest radiograph. EXAM: CT CHEST, ABDOMEN AND PELVIS WITHOUT CONTRAST TECHNIQUE: Multidetector CT imaging of the chest, abdomen and pelvis was performed following the standard protocol without IV contrast. COMPARISON:  10/12/2019 abdomen/pelvic CT and 01/04/2009 PET CT FINDINGS: Please note that parenchymal and vascular abnormalities may be missed without intravenous contrast. CT CHEST FINDINGS Cardiovascular: Cardiomegaly and heavy coronary artery and aortic  atherosclerotic calcifications are again noted. There is no evidence of thoracic aortic aneurysm or pericardial effusion. Mediastinum/Nodes: An enlarged LEFT thyroid gland is unchanged. No abnormal appearing lymph nodes are present. No mediastinal mass or hematoma identified. Lungs/Pleura: Moderate to large bilateral pleural effusions are present with mild to moderate bibasilar atelectasis. Mild ground-glass/interstitial opacities are present, greatest in the central RIGHT lung centrally. Mild biapical pleural-parenchymal scarring again not no pneumothorax noted. Musculoskeletal: Sclerosis within the T10 vertebral body appears to be reactive from adjacent Schmorl's nodes. CT ABDOMEN PELVIS FINDINGS Hepatobiliary: No hepatic or gallbladder abnormalities are identified. No definite biliary dilatation. Pancreas: Unremarkable Spleen: Unremarkable Adrenals/Urinary Tract: The kidneys and adrenal glands are unremarkable. A Foley catheter is present within a collapsed bladder. Stomach/Bowel: Stomach is within normal limits. Appendix appears normal. No evidence of bowel wall thickening, distention, or inflammatory changes. Colonic diverticulosis noted without evidence of acute diverticulitis. Vascular/Lymphatic: Aortic atherosclerosis. No enlarged abdominal or pelvic lymph nodes. Reproductive: Status post hysterectomy. No adnexal masses. Other: A trace amount of free fluid in the pelvis is noted. Mild subcutaneous edema is present. There is no evidence of focal collection or pneumoperitoneum. Musculoskeletal: No acute or suspicious bony abnormalities are present. IMPRESSION: 1. Moderate to large bilateral pleural effusions with mild to moderate bibasilar atelectasis, trace amount of free pelvic fluid and mild subcutaneous edema. 2. Mild ground-glass/interstitial opacities, greatest in the central RIGHT lung, which may represent mild pulmonary edema or less likely infection. 3. Cardiomegaly and coronary artery disease. 4.  Aortic Atherosclerosis (ICD10-I70.0). Electronically Signed   By: Margarette Canada M.D.   On: 11/14/2019 13:08   CT Head Wo Contrast  Result Date: 11/09/2019 CLINICAL DATA:  Dizziness, altered mental status. EXAM: CT HEAD WITHOUT CONTRAST TECHNIQUE: Contiguous axial images were obtained from the base of the skull through the vertex without intravenous contrast. COMPARISON:  October 12, 2019. FINDINGS: Brain: No evidence of acute infarction, hemorrhage, hydrocephalus, extra-axial collection or mass lesion/mass effect. Vascular: No hyperdense vessel or unexpected calcification. Skull: Normal. Negative for fracture or focal lesion. Sinuses/Orbits: No acute finding. Other: None. IMPRESSION: Normal head CT. Electronically Signed   By: Marijo Conception M.D.   On: 11/08/2019 12:53   CT Chest Wo Contrast  Result Date: 11/14/2019 CLINICAL DATA:  86 year old female with shortness of breath, chest and abdominal pain, and bibasilar opacities on recent chest radiograph. EXAM: CT CHEST, ABDOMEN AND PELVIS WITHOUT CONTRAST TECHNIQUE: Multidetector CT imaging of the chest, abdomen and pelvis was performed following the standard protocol without IV contrast. COMPARISON:  10/12/2019 abdomen/pelvic CT and 01/04/2009 PET CT FINDINGS: Please note that parenchymal and vascular abnormalities may be missed without intravenous contrast. CT CHEST FINDINGS Cardiovascular: Cardiomegaly and heavy coronary artery and aortic atherosclerotic calcifications are again noted. There is no evidence of thoracic aortic aneurysm or pericardial effusion. Mediastinum/Nodes: An enlarged LEFT thyroid gland is unchanged. No abnormal appearing lymph nodes are present. No mediastinal mass or hematoma identified. Lungs/Pleura: Moderate to large bilateral pleural effusions are present with mild to moderate bibasilar atelectasis. Mild ground-glass/interstitial opacities are present, greatest in the central RIGHT lung centrally. Mild biapical  pleural-parenchymal scarring again not no pneumothorax noted. Musculoskeletal: Sclerosis within the T10 vertebral body appears to be reactive from adjacent Schmorl's nodes. CT ABDOMEN PELVIS FINDINGS Hepatobiliary: No hepatic or gallbladder abnormalities are identified. No definite biliary dilatation. Pancreas: Unremarkable Spleen: Unremarkable Adrenals/Urinary Tract: The kidneys and adrenal glands are unremarkable. A Foley catheter is present within a collapsed bladder. Stomach/Bowel: Stomach is within normal limits. Appendix appears normal. No evidence of bowel wall thickening, distention, or inflammatory changes. Colonic diverticulosis noted without evidence of acute diverticulitis. Vascular/Lymphatic: Aortic atherosclerosis. No enlarged abdominal or pelvic lymph nodes. Reproductive: Status post hysterectomy. No adnexal masses. Other: A trace amount of free fluid in the pelvis is noted. Mild subcutaneous edema is present. There is no evidence of focal collection or pneumoperitoneum. Musculoskeletal: No acute or suspicious bony abnormalities are present. IMPRESSION: 1. Moderate to large bilateral pleural effusions with mild to moderate bibasilar atelectasis, trace amount of free pelvic fluid and mild subcutaneous edema. 2. Mild ground-glass/interstitial opacities, greatest in the central RIGHT lung, which may represent mild pulmonary edema or less likely infection. 3. Cardiomegaly and coronary artery disease. 4. Aortic Atherosclerosis (ICD10-I70.0). Electronically Signed   By: Margarette Canada M.D.   On: 11/01/2019 13:08   DG Chest Portable 1 View  Result Date: 10/24/2019 CLINICAL DATA:  Weakness. EXAM: PORTABLE CHEST 1 VIEW COMPARISON:  October 16, 2019. FINDINGS: Stable cardiomediastinal silhouette. No pneumothorax is noted. Increased bibasilar opacities are noted concerning for edema with probable small bilateral pleural effusions. Bony thorax is unremarkable. IMPRESSION: Increased bibasilar opacities are  noted concerning for edema with probable small bilateral pleural effusions. Aortic Atherosclerosis (ICD10-I70.0). Electronically Signed   By: Marijo Conception M.D.   On: 10/30/2019 11:52        Scheduled Meds: . aspirin EC  81 mg Oral QODAY  . Chlorhexidine Gluconate Cloth  6 each Topical Daily  . conjugated estrogens  1 Applicatorful Vaginal Daily  . methimazole  5 mg Oral Daily  . mirtazapine  15 mg Oral QHS  . pantoprazole  40 mg Oral Daily   Continuous Infusions: . sodium chloride 75 mL/hr at 11/12/19 0611  . ceFEPime (MAXIPIME) IV    . [START ON 18-Nov-2019] vancomycin       LOS: 1 day    Time spent: 45 minutes    Sharen Hones, MD [facility]   To contact the attending provider between 7A-7P or the covering provider during after hours 7P-7A, please log into the web site [email or web address] and access using universal [place] password for that web site. If you do not have the password, please call the hospital operator.  11/12/2019, 9:00 AM

## 2019-11-12 NOTE — Progress Notes (Signed)
Report called to [place] on first floor, family at bedside aware of pt's transfer. VS wnl at time of transfer.

## 2019-11-12 NOTE — Procedures (Signed)
Ultrasound-guided diagnostic and therapeutic right thoracentesis performed yielding 900 cc of amber colored fluid. No immediate complications. Follow-up chest x-ray pending.The fluid was sent to the lab for preordered studies. EBL none.

## 2019-11-12 NOTE — Progress Notes (Signed)
PHARMACY - PHYSICIAN COMMUNICATION CRITICAL VALUE ALERT - BLOOD CULTURE IDENTIFICATION (BCID)  Kristin Rowe is an 86 y.o. female who presented to [facility] on 10/30/2019 with a chief complaint of SOB and weakness   Assessment:  Patient admitted with severe sepsis due to possible HCAP. BCx 1 of 4 GPC, BCID identified Enterococcus faecalis.   Name of physician (or Provider) Contacted: Dr. Roosevelt Locks   Current antibiotics: Cefepime  Changes to prescribed antibiotics recommended:  Unasyn   Results for orders placed or performed during the hospital encounter of 10/25/2019  Blood Culture ID Panel (Reflexed) (Collected: 11/08/2019 11:24 AM)  Result Value Ref Range   Enterococcus faecalis DETECTED (A) NOT DETECTED   Enterococcus Faecium NOT DETECTED NOT DETECTED   Listeria monocytogenes NOT DETECTED NOT DETECTED   Staphylococcus species NOT DETECTED NOT DETECTED   Staphylococcus aureus (BCID) NOT DETECTED NOT DETECTED   Staphylococcus epidermidis NOT DETECTED NOT DETECTED   Staphylococcus lugdunensis NOT DETECTED NOT DETECTED   Streptococcus species NOT DETECTED NOT DETECTED   Streptococcus agalactiae NOT DETECTED NOT DETECTED   Streptococcus pneumoniae NOT DETECTED NOT DETECTED   Streptococcus pyogenes NOT DETECTED NOT DETECTED   A.calcoaceticus-baumannii NOT DETECTED NOT DETECTED   Bacteroides fragilis NOT DETECTED NOT DETECTED   Enterobacterales NOT DETECTED NOT DETECTED   Enterobacter cloacae complex NOT DETECTED NOT DETECTED   Escherichia coli NOT DETECTED NOT DETECTED   Klebsiella aerogenes NOT DETECTED NOT DETECTED   Klebsiella oxytoca NOT DETECTED NOT DETECTED   Klebsiella pneumoniae NOT DETECTED NOT DETECTED   Proteus species NOT DETECTED NOT DETECTED   Salmonella species NOT DETECTED NOT DETECTED   Serratia marcescens NOT DETECTED NOT DETECTED   Haemophilus influenzae NOT DETECTED NOT DETECTED   Neisseria meningitidis NOT DETECTED NOT DETECTED   Pseudomonas aeruginosa NOT  DETECTED NOT DETECTED   Stenotrophomonas maltophilia NOT DETECTED NOT DETECTED   Candida albicans NOT DETECTED NOT DETECTED   Candida auris NOT DETECTED NOT DETECTED   Candida glabrata NOT DETECTED NOT DETECTED   Candida krusei NOT DETECTED NOT DETECTED   Candida parapsilosis NOT DETECTED NOT DETECTED   Candida tropicalis NOT DETECTED NOT DETECTED   Cryptococcus neoformans/gattii NOT DETECTED NOT DETECTED   Vancomycin resistance NOT DETECTED NOT DETECTED    Pernell Dupre, PharmD, BCPS Clinical Pharmacist 11/12/2019 1:52 PM

## 2019-11-12 NOTE — Consult Note (Signed)
Pharmacy Antibiotic Note  Kristin Rowe is a 86 y.o. female admitted on 11/19/2019 with HCAP. Pt presented from home with weakness. Pt returned to home from postacute rehab 2 weeks ago following hospital visit for pyelonephritis on 9/23. PMH includes PVD, carotid stenosis s/p right endarterectomy, cervical cancer s/p radiation, HTN, GERD, and anxiety. Pharmacy has been consulted for vancomycin and cefepime dosing.  Plan:  Cefepime 2 g q24H   Vancomycin d/c'ed. MRSA PCR was negative. MRSA PCR has a 99.2% negative predictive value for MRSA PNA.   Monitor renal function and adjust as needed  Height: 5\' 4"  (162.6 cm) Weight: 54.4 kg (120 lb) IBW/kg (Calculated) : 54.7  Temp (24hrs), Avg:94.9 F (34.9 C), Min:91.1 F (32.8 C), Max:97.2 F (36.2 C)  Recent Labs  Lab 10/20/2019 1129 11/01/2019 1338 10/24/2019 1705 10/20/2019 2028 11/12/19 0217 11/12/19 0535  WBC 6.3  --   --   --   --  13.8*  CREATININE 1.70*  --   --   --   --  2.06*  LATICACIDVEN 10.3* 4.2* 6.3*  5.2* 4.7* 7.6*  --     Estimated Creatinine Clearance: 15.3 mL/min (A) (by C-G formula based on SCr of 2.06 mg/dL (H)).    Allergies  Allergen Reactions  . Daypro [Oxaprozin] Other (See Comments)    gi  . Morphine And Related Nausea And Vomiting  . Plaquenil [Hydroxychloroquine Sulfate]     Pt denies  . Ridaura [Auranofin] Other (See Comments)    denies    Antimicrobials this admission: 10/23 cefepime >>  10/23 vancomycin >>   Microbiology results: 10/23 BCx: pending 10/23 UCx: pending 10/24 PATH pleural fluid: pending 10/24 MRSA PCR: negative.   Thank you for allowing pharmacy to be a part of this patient's care.  Oswald Hillock, PharmD Pharmacy Resident  11/12/2019 10:59 AM

## 2019-11-13 ENCOUNTER — Inpatient Hospital Stay: Payer: Medicare Other

## 2019-11-13 DIAGNOSIS — Z7189 Other specified counseling: Secondary | ICD-10-CM

## 2019-11-13 DIAGNOSIS — B952 Enterococcus as the cause of diseases classified elsewhere: Secondary | ICD-10-CM

## 2019-11-13 DIAGNOSIS — Z66 Do not resuscitate: Secondary | ICD-10-CM

## 2019-11-13 DIAGNOSIS — I4891 Unspecified atrial fibrillation: Secondary | ICD-10-CM

## 2019-11-13 DIAGNOSIS — D649 Anemia, unspecified: Secondary | ICD-10-CM | POA: Diagnosis not present

## 2019-11-13 DIAGNOSIS — R531 Weakness: Secondary | ICD-10-CM

## 2019-11-13 DIAGNOSIS — R7881 Bacteremia: Secondary | ICD-10-CM | POA: Diagnosis not present

## 2019-11-13 DIAGNOSIS — Z515 Encounter for palliative care: Secondary | ICD-10-CM

## 2019-11-13 DIAGNOSIS — R5383 Other fatigue: Secondary | ICD-10-CM

## 2019-11-13 DIAGNOSIS — N179 Acute kidney failure, unspecified: Secondary | ICD-10-CM | POA: Diagnosis not present

## 2019-11-13 DIAGNOSIS — T68XXXA Hypothermia, initial encounter: Secondary | ICD-10-CM | POA: Diagnosis not present

## 2019-11-13 DIAGNOSIS — R945 Abnormal results of liver function studies: Secondary | ICD-10-CM | POA: Diagnosis not present

## 2019-11-13 DIAGNOSIS — A419 Sepsis, unspecified organism: Secondary | ICD-10-CM | POA: Diagnosis not present

## 2019-11-13 DIAGNOSIS — G9341 Metabolic encephalopathy: Secondary | ICD-10-CM | POA: Diagnosis not present

## 2019-11-13 DIAGNOSIS — J90 Pleural effusion, not elsewhere classified: Secondary | ICD-10-CM | POA: Diagnosis not present

## 2019-11-13 LAB — CBC WITH DIFFERENTIAL/PLATELET
Abs Immature Granulocytes: 0.16 10*3/uL — ABNORMAL HIGH (ref 0.00–0.07)
Basophils Absolute: 0 10*3/uL (ref 0.0–0.1)
Basophils Relative: 0 %
Eosinophils Absolute: 0 10*3/uL (ref 0.0–0.5)
Eosinophils Relative: 0 %
HCT: 33.4 % — ABNORMAL LOW (ref 36.0–46.0)
Hemoglobin: 9.8 g/dL — ABNORMAL LOW (ref 12.0–15.0)
Immature Granulocytes: 1 %
Lymphocytes Relative: 2 %
Lymphs Abs: 0.4 10*3/uL — ABNORMAL LOW (ref 0.7–4.0)
MCH: 29.1 pg (ref 26.0–34.0)
MCHC: 29.3 g/dL — ABNORMAL LOW (ref 30.0–36.0)
MCV: 99.1 fL (ref 80.0–100.0)
Monocytes Absolute: 1.9 10*3/uL — ABNORMAL HIGH (ref 0.1–1.0)
Monocytes Relative: 12 %
Neutro Abs: 14.2 10*3/uL — ABNORMAL HIGH (ref 1.7–7.7)
Neutrophils Relative %: 85 %
Platelets: 123 10*3/uL — ABNORMAL LOW (ref 150–400)
RBC: 3.37 MIL/uL — ABNORMAL LOW (ref 3.87–5.11)
RDW: 20 % — ABNORMAL HIGH (ref 11.5–15.5)
WBC: 16.8 10*3/uL — ABNORMAL HIGH (ref 4.0–10.5)
nRBC: 0.3 % — ABNORMAL HIGH (ref 0.0–0.2)

## 2019-11-13 LAB — BASIC METABOLIC PANEL
Anion gap: 15 (ref 5–15)
BUN: 54 mg/dL — ABNORMAL HIGH (ref 8–23)
CO2: 16 mmol/L — ABNORMAL LOW (ref 22–32)
Calcium: 7.9 mg/dL — ABNORMAL LOW (ref 8.9–10.3)
Chloride: 106 mmol/L (ref 98–111)
Creatinine, Ser: 2.76 mg/dL — ABNORMAL HIGH (ref 0.44–1.00)
GFR, Estimated: 16 mL/min — ABNORMAL LOW (ref >60)
Glucose, Bld: 100 mg/dL — ABNORMAL HIGH (ref 70–99)
Potassium: 4.8 mmol/L (ref 3.5–5.1)
Sodium: 137 mmol/L (ref 135–145)

## 2019-11-13 LAB — URINE CULTURE: Culture: <10000 — AB

## 2019-11-13 LAB — HEPATITIS PANEL, ACUTE
HCV Ab: NONREACTIVE
Hep A IgM: UNDETERMINED — AB
Hep B C IgM: NONREACTIVE
Hepatitis B Surface Ag: NONREACTIVE

## 2019-11-13 LAB — PROTEIN, BODY FLUID (OTHER): Total Protein, Body Fluid Other: 1.7 g/dL

## 2019-11-13 LAB — LACTIC ACID, PLASMA
Lactic Acid, Venous: 4.4 mmol/L (ref 0.5–1.9)
Lactic Acid, Venous: 4.3 mmol/L (ref 0.5–1.9)

## 2019-11-13 LAB — PH, BODY FLUID: pH, Body Fluid: 7.5

## 2019-11-13 LAB — TSH: TSH: 2.789 u[IU]/mL (ref 0.350–4.500)

## 2019-11-13 LAB — LEGIONELLA PNEUMOPHILA SEROGP 1 UR AG: L. pneumophila Serogp 1 Ur Ag: NEGATIVE

## 2019-11-13 LAB — TRIGLYCERIDES, BODY FLUIDS: Triglycerides, Fluid: 28 mg/dL

## 2019-11-13 LAB — MAGNESIUM: Magnesium: 1.9 mg/dL (ref 1.7–2.4)

## 2019-11-13 MED ORDER — GLYCOPYRROLATE 0.2 MG/ML IJ SOLN
0.2000 mg | INTRAMUSCULAR | Status: DC | PRN
  Administered 2019-11-13: 15:00:00 0.2 mg via INTRAVENOUS
  Filled 2019-11-13: qty 1

## 2019-11-13 MED ORDER — SODIUM CHLORIDE 0.9 % IV SOLN
2.0000 g | Freq: Two times a day (BID) | INTRAVENOUS | Status: DC
  Filled 2019-11-13: qty 2000

## 2019-11-13 MED ORDER — ONDANSETRON HCL 4 MG/2ML IJ SOLN: 4.0000 mg | Freq: Four times a day (QID) | INTRAMUSCULAR | Status: DC | PRN

## 2019-11-13 MED ORDER — ACETAMINOPHEN 650 MG RE SUPP: 650.0000 mg | Freq: Four times a day (QID) | RECTAL | Status: DC | PRN

## 2019-11-13 MED ORDER — LORAZEPAM 2 MG/ML IJ SOLN: 0.5000 mg | INTRAMUSCULAR | Status: DC | PRN

## 2019-11-13 MED ORDER — SODIUM CHLORIDE 0.9 % IV BOLUS: 1000.0000 mL | Freq: Once | INTRAVENOUS | Status: DC

## 2019-11-13 MED ORDER — LORAZEPAM 2 MG/ML IJ SOLN
0.5000 mg | INTRAMUSCULAR | Status: AC
  Administered 2019-11-13: 13:00:00 0.5 mg via INTRAVENOUS
  Filled 2019-11-13: qty 1

## 2019-11-13 MED ORDER — METRONIDAZOLE IN NACL 5-0.79 MG/ML-% IV SOLN
500.0000 mg | Freq: Three times a day (TID) | INTRAVENOUS | Status: DC
  Administered 2019-11-13: 10:00:00 500 mg via INTRAVENOUS
  Filled 2019-11-13 (×3): qty 100

## 2019-11-13 MED ORDER — HYDROMORPHONE HCL 1 MG/ML IJ SOLN
0.5000 mg | INTRAMUSCULAR | Status: DC | PRN
  Administered 2019-11-13: 0.5 mg via INTRAVENOUS

## 2019-11-13 MED ORDER — QUETIAPINE FUMARATE 25 MG PO TABS: 25.0000 mg | ORAL_TABLET | Freq: Every day | ORAL | Status: DC

## 2019-11-13 MED ORDER — BIOTENE DRY MOUTH MT LIQD: 15.0000 mL | OROMUCOSAL | Status: DC | PRN

## 2019-11-13 MED ORDER — GLYCOPYRROLATE 1 MG PO TABS
1.0000 mg | ORAL_TABLET | ORAL | Status: DC | PRN
  Filled 2019-11-13: qty 1

## 2019-11-13 MED ORDER — OXYCODONE HCL 5 MG PO TABS: 5.0000 mg | ORAL_TABLET | ORAL | Status: DC | PRN

## 2019-11-13 MED ORDER — POLYVINYL ALCOHOL 1.4 % OP SOLN
1.0000 [drp] | Freq: Four times a day (QID) | OPHTHALMIC | Status: DC | PRN
  Filled 2019-11-13: qty 15

## 2019-11-13 MED ORDER — SODIUM CHLORIDE 0.9 % IV SOLN
2.0000 g | Freq: Two times a day (BID) | INTRAVENOUS | Status: DC
  Filled 2019-11-13 (×2): qty 20

## 2019-11-13 MED ORDER — HYDROMORPHONE HCL 1 MG/ML IJ SOLN
0.5000 mg | INTRAMUSCULAR | Status: DC | PRN
  Filled 2019-11-13: qty 1

## 2019-11-13 MED ORDER — GLYCOPYRROLATE 0.2 MG/ML IJ SOLN: 0.2000 mg | INTRAMUSCULAR | Status: DC | PRN

## 2019-11-13 MED ORDER — ACETAMINOPHEN 325 MG PO TABS: 650.0000 mg | ORAL_TABLET | Freq: Four times a day (QID) | ORAL | Status: DC | PRN

## 2019-11-13 MED ORDER — HALOPERIDOL LACTATE 5 MG/ML IJ SOLN: 0.5000 mg | INTRAMUSCULAR | Status: DC | PRN

## 2019-11-14 LAB — CULTURE, BLOOD (ROUTINE X 2): Special Requests: ADEQUATE

## 2019-11-14 LAB — CYTOLOGY - NON PAP

## 2019-11-15 LAB — BODY FLUID CULTURE: Culture: NO GROWTH

## 2019-11-16 LAB — CULTURE, BLOOD (ROUTINE X 2)
Culture: NO GROWTH
Special Requests: ADEQUATE

## 2019-11-18 LAB — CHOLESTEROL, BODY FLUID: Cholesterol, Fluid: 20 mg/dL

## 2019-11-20 NOTE — Progress Notes (Addendum)
[facility]) Hospital Liaison RN note:  Received request from Jobe Gibbon, NP for family interest in [place]. Doran Clay TOC notified. Chart reviewed and eligibility has been approved. Spoke with daughters, [name] and Butch Penny in the room to confirm interest and explain services. They verbalized understanding and had no questions.   Hospice Home does not have bed to offer today. Hospital care team is aware. [place] Liaison will follow for room availability.  Please call with any hospice related questions or concerns.   Thank you for the opportunity to participate in this patient's care.  Zandra Abts, RN [facility] Liaison [phone]

## 2019-11-20 NOTE — Progress Notes (Signed)
   Nov 19, 2019 0425  Assess: MEWS Score  Temp 97.7 F (36.5 C)  BP (!) 96/55  Pulse Rate 82  Resp (!) 21  SpO2 91 %  O2 Device Room Air  Assess: MEWS Score  MEWS Temp 0  MEWS Systolic 1  MEWS Pulse 0  MEWS RR 1  MEWS LOC 0  MEWS Score 2  MEWS Score Color Yellow  Assess: if the MEWS score is Yellow or Red  Were vital signs taken at a resting state? Yes  Focused Assessment No change from prior assessment  Early Detection of Sepsis Score *See Row Information* Medium  MEWS guidelines implemented *See Row Information* No, vital signs rechecked  Document  Progress note created (see row info) Yes  Pt resting comfortably - will continue to monitor.

## 2019-11-20 NOTE — Consult Note (Signed)
Cardiology Consultation Note    Patient ID: Kristin Rowe, MRN: [id number], DOB/AGE: Mar 19, 1926 86 y.o. Admit date: 10/27/2019   Date of Consult: Dec 07, 2019 Primary Physician: [name], MD Primary Cardiologist: Dr. Ubaldo Glassing  Chief Complaint: weakness and fatigue Reason for Consultation: sepsis Requesting MD: Dr. Roosevelt Locks  HPI: Kristin Rowe is a 86 y.o. female with history of hypertension, COPD, peripheral vascular disease, atrial fibrillation not felt to be a candidate for anticoagulation due to bleeding and fall risk, history of carotid artery disease status post endarterectomy, chronic kidney disease, recent treatment for pyelonephritis who was admitted with complaints of weakness and fatigue.  She was recently hospitalized with A. fib with RVR.  She was relatively hypotensive on presentation improving with normal saline.  She was Covid negative.  Had a lactate of 10.3.  High-sensitivity troponin was negative..  Patient was tachycardic on presentation.  Head CT was negative for acute abnormality.  Chest x-ray revealed moderate to large bilateral pleural effusions.  She was felt to have sepsis with septic shock.  She underwent thoracentesis with fluid showing no growth thus far.  Blood culture revealed Enterococcus.  Transthoracic echo was done which was read as showing preserved LV function with moderate TR, moderate MR.  Palliative care has been consulted and this work-up is pending.  Acute on chronic renal insufficiency with a creatinine of 2.76 up from 1.72 days ago. Patient is very combative and not able to give a history. 2 daughters are at bedside. I discussed referral for TEE. They are adamantly against any invasive procedures at least at the present time. They are seeing palliative care at the time. 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 Authorizing Provider  ALPRAZolam Duanne Moron) 0.25 MG tablet Take 0.25 mg by mouth daily as needed for anxiety.    Yes [provider]  aspirin EC 81 MG tablet Take 81 mg every other day by mouth.    Yes [provider]  Cod Liver Oil CAPS Take 1 capsule by mouth every other day.    Yes [provider]  conjugated estrogens (PREMARIN) vaginal cream Place 1 Applicatorful vaginally daily. Apply 0.5mg  (pea-sized amount)  just inside the vaginal introitus with a finger-tip on  Monday, Wednesday and Friday nights. 08/08/19  Yes McGowan, Larene Beach A, PA-C  methimazole (TAPAZOLE) 5 MG tablet Take 5 mg by mouth daily.   Yes [provider]  metoprolol (LOPRESSOR) 100 MG tablet Take 100 mg by mouth 2 (two) times daily.    Yes [provider]  mirtazapine (REMERON) 15 MG tablet Take 15 mg by mouth at bedtime.  01/25/18 10/24/2019 Yes [provider]  pantoprazole (PROTONIX) 40 MG tablet Take 40 mg by mouth daily. 08/25/19  Yes [provider]  sodium chloride (OCEAN) 0.65 % SOLN nasal spray Place 1 spray into both nostrils as needed for congestion. 10/17/19  Yes Sreenath, Sudheer B, MD  Clobetasol Prop Emollient Base (CLOBETASOL PROPIONATE E) 0.05 % emollient cream Apply 1 application topically 2 (two) times daily. Patient not taking: Reported on 10/22/2019 08/08/19   Nori Riis, PA-C    Inpatient  Medications:   aspirin EC  81 mg Oral QODAY   Chlorhexidine Gluconate Cloth  6 each Topical Daily   conjugated estrogens  1 Applicatorful Vaginal Daily   lactulose  20 g Oral Once   methimazole  5 mg Oral Daily   mirtazapine  15 mg Oral QHS   pantoprazole  40 mg Oral Daily   QUEtiapine  25 mg Oral QHS    sodium chloride 150 mL/hr at 11-23-19 1023   ampicillin (OMNIPEN) IV     cefTRIAXone (ROCEPHIN)  IV     metronidazole 500 mg (11-23-19 1023)   sodium chloride      Allergies:  Allergies  Allergen Reactions   Daypro [Oxaprozin] Other (See Comments)    gi   Morphine And Related Nausea And Vomiting   Plaquenil [Hydroxychloroquine Sulfate]     Pt denies   Ridaura [Auranofin] Other (See Comments)    denies    Social History   Socioeconomic History   Marital status: Widowed    Spouse name: Not on file   Number of children: Not on file   Years of education: Not on file   Highest education level: Not on file  Occupational History   Not on file  Tobacco Use   Smoking status: Former Smoker    Quit date: 10/09/1985    Years since quitting: 34.1   Smokeless tobacco: Never Used  Vaping Use   Vaping Use: Never used  Substance and Sexual Activity   Alcohol use: No   Drug use: No   Sexual activity: Never  Other Topics Concern   Not on file  Social History Narrative   Not on file   Social Determinants of Health   Financial Resource Strain:    Difficulty of Paying Living Expenses: Not on file  Food Insecurity:    Worried About Charity fundraiser in the Last Year: Not on file   [employer] of Food in the Last Year: Not on file  Transportation Needs:    Lack of Transportation (Medical): Not on file   Lack of Transportation (Non-Medical): Not on file  Physical Activity:    Days of Exercise per Week: Not on file   Minutes of Exercise per Session: Not on file  Stress:    Feeling of Stress : Not on file  Social Connections:    Frequency of Communication with Friends and  Family: Not on file   Frequency of Social Gatherings with Friends and Family: Not on file   Attends Religious Services: Not on file   Active Member of Clubs or Organizations: Not on file   Attends Archivist Meetings: Not on file   Marital Status: Not on file  Intimate Partner Violence:    Fear of Current or Ex-Partner: Not on file   Emotionally Abused: Not on file   Physically Abused: Not on file   Sexually Abused: Not on file     Family History  Problem Relation Age of Onset   Kidney disease  Sister    Kidney Stones Sister    Kidney cancer Neg Hx    Prostate cancer Neg Hx      Review of Systems: A 12-system review of systems was performed and is negative except as noted in the HPI.  Labs: No results for input(s): CKTOTAL, CKMB, TROPONINI in the last 72 hours. Lab Results  Component Value Date   WBC 16.8 (H) 2019-12-02   HGB 9.8 (L) 2019-12-02   HCT 33.4 (L) December 02, 2019   MCV 99.1 December 02, 2019   PLT 123 (L) 12/02/19    Recent Labs  Lab 10/21/2019 1129 11/12/19 0535 Dec 02, 2019 0541  NA 138   < > 137  K 4.6   < > 4.8  CL 99   < > 106  CO2 20*   < > 16*  BUN 30*   < > 54*  CREATININE 1.70*   < > 2.76*  CALCIUM 8.9   < > 7.9*  PROT 7.7  --   --   BILITOT 1.6*  --   --   ALKPHOS 84  --   --   ALT 53*  --   --   AST 104*  --   --   GLUCOSE 129*   < > 100*   < > = values in this interval not displayed.   No results found for: CHOL, HDL, LDLCALC, TRIG No results found for: DDIMER  Radiology/Studies:  CT ABDOMEN PELVIS WO CONTRAST  Result Date: 10/26/2019 CLINICAL DATA:  86 year old female with shortness of breath, chest and abdominal pain, and bibasilar opacities on recent chest radiograph. EXAM: CT CHEST, ABDOMEN AND PELVIS WITHOUT CONTRAST TECHNIQUE: Multidetector CT imaging of the chest, abdomen and pelvis was performed following the standard protocol without IV contrast. COMPARISON:  10/12/2019 abdomen/pelvic CT and 01/04/2009 PET CT FINDINGS: Please note  that parenchymal and vascular abnormalities may be missed without intravenous contrast. CT CHEST FINDINGS Cardiovascular: Cardiomegaly and heavy coronary artery and aortic atherosclerotic calcifications are again noted. There is no evidence of thoracic aortic aneurysm or pericardial effusion. Mediastinum/Nodes: An enlarged LEFT thyroid gland is unchanged. No abnormal appearing lymph nodes are present. No mediastinal mass or hematoma identified. Lungs/Pleura: Moderate to large bilateral pleural effusions are present with mild to moderate bibasilar atelectasis. Mild ground-glass/interstitial opacities are present, greatest in the central RIGHT lung centrally. Mild biapical pleural-parenchymal scarring again not no pneumothorax noted. Musculoskeletal: Sclerosis within the T10 vertebral body appears to be reactive from adjacent Schmorl's nodes. CT ABDOMEN PELVIS FINDINGS Hepatobiliary: No hepatic or gallbladder abnormalities are identified. No definite biliary dilatation. Pancreas: Unremarkable Spleen: Unremarkable Adrenals/Urinary Tract: The kidneys and adrenal glands are unremarkable. A Foley catheter is present within a collapsed bladder. Stomach/Bowel: Stomach is within normal limits. Appendix appears normal. No evidence of bowel wall thickening, distention, or inflammatory changes. Colonic diverticulosis noted without evidence of acute diverticulitis. Vascular/Lymphatic: Aortic atherosclerosis. No enlarged abdominal or pelvic lymph nodes. Reproductive: Status post hysterectomy. No adnexal masses. Other: A trace amount of free fluid in the pelvis is noted. Mild subcutaneous edema is present. There is no evidence of focal collection or pneumoperitoneum. Musculoskeletal: No acute or suspicious bony abnormalities are present. IMPRESSION: 1. Moderate to large bilateral pleural effusions with mild to moderate bibasilar atelectasis, trace amount of free pelvic fluid and mild subcutaneous edema. 2. Mild  ground-glass/interstitial opacities, greatest in the central RIGHT lung, which may represent mild pulmonary edema or less likely infection. 3. Cardiomegaly and coronary artery disease. 4. Aortic Atherosclerosis (ICD10-I70.0). Electronically Signed  By: Margarette Canada M.D.   On: 10/20/2019 13:08   DG Abd 1 View  Result Date: Dec 01, 2019 CLINICAL DATA:  Abdominal pain, history of vaginal cancer EXAM: ABDOMEN - 1 VIEW COMPARISON:  Portable exam 0948 hours compared to CT abdomen and pelvis 11/10/2019 FINDINGS: Subsegmental atelectasis LEFT lung base. Question enlargement of cardiac silhouette. Nonobstructive bowel gas pattern. No bowel dilatation or bowel wall thickening. Osseous demineralization with degenerative changes of the thoracolumbar spine. Atherosclerotic calcifications aorta, iliac arteries, splenic artery. No urinary tract calcification. IMPRESSION: No acute abdominal findings. Aortic Atherosclerosis (ICD10-I70.0). Electronically Signed   By: Lavonia Dana M.D.   On: 12/01/2019 10:06   CT Head Wo Contrast  Result Date: 11/16/2019 CLINICAL DATA:  Dizziness, altered mental status. EXAM: CT HEAD WITHOUT CONTRAST TECHNIQUE: Contiguous axial images were obtained from the base of the skull through the vertex without intravenous contrast. COMPARISON:  October 12, 2019. FINDINGS: Brain: No evidence of acute infarction, hemorrhage, hydrocephalus, extra-axial collection or mass lesion/mass effect. Vascular: No hyperdense vessel or unexpected calcification. Skull: Normal. Negative for fracture or focal lesion. Sinuses/Orbits: No acute finding. Other: None. IMPRESSION: Normal head CT. Electronically Signed   By: Marijo Conception M.D.   On: 10/25/2019 12:53   CT Chest Wo Contrast  Result Date: 11/06/2019 CLINICAL DATA:  86 year old female with shortness of breath, chest and abdominal pain, and bibasilar opacities on recent chest radiograph. EXAM: CT CHEST, ABDOMEN AND PELVIS WITHOUT CONTRAST TECHNIQUE:  Multidetector CT imaging of the chest, abdomen and pelvis was performed following the standard protocol without IV contrast. COMPARISON:  10/12/2019 abdomen/pelvic CT and 01/04/2009 PET CT FINDINGS: Please note that parenchymal and vascular abnormalities may be missed without intravenous contrast. CT CHEST FINDINGS Cardiovascular: Cardiomegaly and heavy coronary artery and aortic atherosclerotic calcifications are again noted. There is no evidence of thoracic aortic aneurysm or pericardial effusion. Mediastinum/Nodes: An enlarged LEFT thyroid gland is unchanged. No abnormal appearing lymph nodes are present. No mediastinal mass or hematoma identified. Lungs/Pleura: Moderate to large bilateral pleural effusions are present with mild to moderate bibasilar atelectasis. Mild ground-glass/interstitial opacities are present, greatest in the central RIGHT lung centrally. Mild biapical pleural-parenchymal scarring again not no pneumothorax noted. Musculoskeletal: Sclerosis within the T10 vertebral body appears to be reactive from adjacent Schmorl's nodes. CT ABDOMEN PELVIS FINDINGS Hepatobiliary: No hepatic or gallbladder abnormalities are identified. No definite biliary dilatation. Pancreas: Unremarkable Spleen: Unremarkable Adrenals/Urinary Tract: The kidneys and adrenal glands are unremarkable. A Foley catheter is present within a collapsed bladder. Stomach/Bowel: Stomach is within normal limits. Appendix appears normal. No evidence of bowel wall thickening, distention, or inflammatory changes. Colonic diverticulosis noted without evidence of acute diverticulitis. Vascular/Lymphatic: Aortic atherosclerosis. No enlarged abdominal or pelvic lymph nodes. Reproductive: Status post hysterectomy. No adnexal masses. Other: A trace amount of free fluid in the pelvis is noted. Mild subcutaneous edema is present. There is no evidence of focal collection or pneumoperitoneum. Musculoskeletal: No acute or suspicious bony  abnormalities are present. IMPRESSION: 1. Moderate to large bilateral pleural effusions with mild to moderate bibasilar atelectasis, trace amount of free pelvic fluid and mild subcutaneous edema. 2. Mild ground-glass/interstitial opacities, greatest in the central RIGHT lung, which may represent mild pulmonary edema or less likely infection. 3. Cardiomegaly and coronary artery disease. 4. Aortic Atherosclerosis (ICD10-I70.0). Electronically Signed   By: Margarette Canada M.D.   On: 11/12/2019 13:08   DG Chest Port 1 View  Result Date: 11/12/2019 CLINICAL DATA:  Status post RIGHT thoracentesis. EXAM: PORTABLE CHEST 1 VIEW  COMPARISON:  Chest x-ray dated 10/23/2019. FINDINGS: Improved aeration of the RIGHT lower lung status post thoracentesis. No pneumothorax. Heart size and mediastinal contours are stable. Probable mild atelectasis and/or small pleural effusion at the LEFT lung base. IMPRESSION: 1. Improved aeration of the RIGHT lower lung status post thoracentesis. No pneumothorax. 2. Probable mild atelectasis and/or small pleural effusion at the LEFT lung base. Electronically Signed   By: Franki Cabot M.D.   On: 11/12/2019 10:59   DG Chest Portable 1 View  Result Date: 10/21/2019 CLINICAL DATA:  Weakness. EXAM: PORTABLE CHEST 1 VIEW COMPARISON:  October 16, 2019. FINDINGS: Stable cardiomediastinal silhouette. No pneumothorax is noted. Increased bibasilar opacities are noted concerning for edema with probable small bilateral pleural effusions. Bony thorax is unremarkable. IMPRESSION: Increased bibasilar opacities are noted concerning for edema with probable small bilateral pleural effusions. Aortic Atherosclerosis (ICD10-I70.0). Electronically Signed   By: Marijo Conception M.D.   On: 11/09/2019 11:52   DG Chest Port 1 View  Result Date: 10/16/2019 CLINICAL DATA:  Cough.  COPD. EXAM: PORTABLE CHEST 1 VIEW COMPARISON:  10/12/2019. FINDINGS: Mediastinum and hilar structures normal. Stable cardiomegaly. No  pulmonary venous congestion. COPD. Stable mild bilateral interstitial prominence, most likely chronic. Active interstitial process including pneumonitis cannot be excluded. No pleural effusion or pneumothorax. Stable biapical pleural thickening consistent with scarring. Degenerative change thoracic spine. IMPRESSION: 1. COPD. Stable mild bilateral interstitial prominence, most likely chronic. Active interstitial process including pneumonitis cannot be excluded. 2.  Stable cardiomegaly.  No pulmonary venous congestion. Electronically Signed   By: Marcello Moores  Register   On: 10/16/2019 08:54   US ABDOMEN LIMITED RUQ (LIVER/GB)  Result Date: 11/12/2019 CLINICAL DATA:  Abnormal LFTs. EXAM: ULTRASOUND ABDOMEN LIMITED RIGHT UPPER QUADRANT COMPARISON:  CT 11/03/2019 FINDINGS: Gallbladder: Gallbladder is physiologically distended containing multiple calculi. No wall thickening. No pericholecystic fluid. Sonographer reports no sonographic Murphy's sign. Common bile duct: Diameter: 4.2 mm, unremarkable Liver: No focal lesion identified. Within normal limits in parenchymal echogenicity. Portal vein is patent on color Doppler imaging with normal direction of blood flow towards the liver. Other: None. IMPRESSION: Cholelithiasis without other ultrasound evidence of cholecystitis or biliary obstruction. Electronically Signed   By: Lucrezia Europe M.D.   On: 11/12/2019 11:48   US THORACENTESIS ASP PLEURAL SPACE W/IMG GUIDE  Result Date: 11/12/2019 INDICATION: Patient with history of COPD, vaginal cancer, atrial fibrillation, dyspnea, bilateral pleural effusions, chronic kidney disease; request received for diagnostic and therapeutic right thoracentesis. EXAM: ULTRASOUND GUIDED DIAGNOSTIC AND THERAPEUTIC RIGHT THORACENTESIS MEDICATIONS: 1% lidocaine to skin and subcutaneous tissue COMPLICATIONS: None immediate.  No pneumothorax on follow-up chest radiograph. PROCEDURE: An ultrasound guided thoracentesis was thoroughly discussed with  the patient's daughter and questions answered. The benefits, risks, alternatives and complications were also discussed. The patient understands and wishes to proceed with the procedure. Written consent was obtained. Ultrasound was performed to localize and mark an adequate pocket of fluid in the right chest. The area was then prepped and draped in the normal sterile fashion. 1% Lidocaine was used for local anesthesia. Under ultrasound guidance a 6 Fr Safe-T-Centesis catheter was introduced. Thoracentesis was performed. The catheter was removed and a dressing applied. FINDINGS: A total of approximately 900 cc of amber fluid was removed. Samples were sent to the laboratory as requested by the clinical team. IMPRESSION: Successful ultrasound guided diagnostic and therapeutic right thoracentesis yielding 900 cc of pleural fluid. Read by: Rowe Robert, PA-C Electronically Signed   By: Lucrezia Europe M.D.   On:  11/12/2019 09:26    Wt Readings from Last 3 Encounters:  10/26/2019 54.4 kg  10/12/19 54.4 kg  08/08/19 53.3 kg    EKG: Atrial fibrillation with rapid ventricular response  Physical Exam:  Blood pressure (!) 107/48, pulse 83, temperature (!) 95.4 F (35.2 C), temperature source Rectal, resp. rate 18, height 5\' 4"  (1.626 m), weight 54.4 kg, SpO2 93 %. Body mass index is 20.6 kg/m. General: Somewhat agitated elderly female Head: Normocephalic, atraumatic, sclera non-icteric, no xanthomas, nares are without discharge.  Neck: Negative for carotid bruits. JVD not elevated. Lungs: Clear bilaterally to auscultation without wheezes, rales, or rhonchi. Breathing is unlabored. Heart: Irregular regular rhythm Abdomen: Soft, non-tender, non-distended with normoactive bowel sounds. No hepatomegaly. No rebound/guarding. No obvious abdominal masses. Msk:  Strength and tone appear normal for age. Extremities: No clubbing or cyanosis. No edema.  Distal pedal pulses are 2+ and equal bilaterally. Neuro: Very  agitated.     Assessment and Plan  5 f-year-old female with dementia, atrial fibrillation, hypertension, COPD admitted with weakness and fatigue. Diagnosed with sepsis with bacteremia. Asked to see patient regarding TEE to further evaluate for endocarditis. Patient is thoracic echo was read as showing preserved LV function with mild to moderate MR and TR. No vegetations noted. Discussion with the patient's daughters. Patient is unable to give a history or consent. Patient is at very high risk for moderate sedation for TEE. All procedures were deferred by the patient's daughters. Palliative care is actively seeing the patient. Would defer TEE at present due to patient's daughter's wishes as well as high risk of the procedure. We will continue with current therapy.  Signed, Teodoro Spray MD 11-26-2019, 12:15 PM Pager: [phone]

## 2019-11-20 NOTE — Death Summary Note (Signed)
Zaelyn Noack Rumleyis a 86 y.o.femalewith medical history significant ofhypertension, COPD, GERD, depression, anxiety, PVD,vaginalcancer (s/p of radiation therapy, vaginectomy, hysterectomy), A. fib not on anticoagulants, hyperthyroidism, carotid artery stenosis (s/p for endarterectomy), recent treatment for pyelonephritis, CKD-3, who presents with shortness of breath and generalized weakness. In the emergency room, patient had a tachycardia of 130, tachypnea. Lactic acid level 10.3. No leukocytosis. CT abdomen/pelvis and chest showed bilateral pleural effusion, mild groundglass interstitial opacities in the bilateral lower lobe. She is giving IV fluids, she also started on broad-spectrum antibiotics with vancomycin and cefepime.  10/24.Thoracentesis scheduled for today, procalcitonin level 0.17. Not consistent with infection. Obtain palliative care consult.  10/25.    Patient blood culture came back with Enterococcus, she has Enterococcus septic shock.  Infect disease was consulted.  However, after discussed with the family and operative care, the decision was made to pursue comfort care.  All active treatment discontinued.  Patient has died in peace at 1640 hrs.  Cause of death: Septic shock with hypovolemic shock. Enterococcus faecalis septicemia Bilateral pleural effusion Acute renal failure on chronic kidney disease stage IIIa Multiorgan failure with renal failure, liver failure and a severe lactic acidosis Baseline dementia with acute metabolic encephalopathy Hypothermia secondary to septic shock.

## 2019-11-20 NOTE — TOC Progression Note (Signed)
Transition of Care [facility]) - Progression Note    Patient Details  Name: HAILEI BESSER MRN: [id number] Date of Birth: [birth year]  Transition of [facility]) CM/SW Contact  Shelbie Hutching, RN Phone Number: 2019/11/17, 2:29 PM  Clinical Narrative:    Family has decided on hospice care.  Referral has been given to Zandra Abts with [facility].  No hospice home bed available today.    Expected Discharge Plan:  (undetermined) Barriers to Discharge: Continued Medical Work up  Expected Discharge Plan and Services Expected Discharge Plan:  (undetermined)   Discharge Planning Services: CM Consult   Living arrangements for the past 2 months: Single Family Home                                       Social Determinants of Health (SDOH) Interventions    Readmission Risk Interventions No flowsheet data found.

## 2019-11-20 NOTE — Progress Notes (Addendum)
PROGRESS NOTE    Kristin Rowe  CBJ:[id number] DOB: Oct 28, 1926 DOA: 10/27/2019 PCP: [name], MD   Chief complaint.  Shortness of breath. Brief Narrative:  Kristin Rowe a 86 y.o.femalewith medical history significant ofhypertension, COPD, GERD, depression, anxiety, PVD,vaginalcancer (s/p of radiation therapy, vaginectomy, hysterectomy), A. fib not on anticoagulants, hyperthyroidism, carotid artery stenosis (s/p for endarterectomy), recent treatment for pyelonephritis, CKD-3, who presents with shortness of breath and generalized weakness. In the emergency room, patient had a tachycardia of 130, tachypnea. Lactic acid level 10.3. No leukocytosis. CT abdomen/pelvis and chest showed bilateral pleural effusion, mild groundglass interstitial opacities in the bilateral lower lobe. She is giving IV fluids, she also started on broad-spectrum antibiotics with vancomycin and cefepime.  10/24. Thoracentesis scheduled for today, procalcitonin level 0.17. Not consistent with infection. Obtain palliative care consult.  10/25.  She started having loose stools.  Check C. difficile toxin.  Added IV Flagyl.   Assessment & Plan:   Principal Problem:   Severe sepsis with septic shock (HCC) Active Problems:   Essential hypertension   HCAP (healthcare-associated pneumonia)   Bilateral pleural effusion   Atrial fibrillation, chronic (HCC)   Acute renal failure superimposed on stage 3a chronic kidney disease (HCC)   Abnormal LFTs   Hypothermia   Hyperthyroidism   Depression  #1.  Shock with combined hypovolemic shock and septic shock. Enterococcus faecalis septicemia. Patient clearly has shock physiology at the time of admission, could be combination of sepsis and hypovolemia. Antibiotic switched to ampicillin and Rocephin. Discussed with infect disease, obtain TEE. Cardiogenic shock is less likely as patient only has mild BNP elevation with acute kidney failure.  Echocardiogram  showed normal ejection fraction at 55%, moderate pulmonary hypertension.  Right upper quadrant ultrasound did not show cholecystitis, patient has cholelithiasis. Patient lactic acid level still 4.4 today, patient did not have significant hypoxemia, I will increase the fluids to 150 mL/h.  Also give additional 1 L of fluid bolus, given patient advanced age, valvular disease, will be cautious with fluids.  #2.  Dementia with acute metabolic encephalopathy. POA. Patient became agitated since last night.  She did not sleep well.  We will give Seroquel at nighttime.  3.  Bilateral pleural effusion. Status post thoracentesis with removal of 900 mL of fluids.  Study showed transudate, not consistent with infection.  Repeated chest x-ray after thoracentesis did not see any pneumonia.  As result, pneumonia is ruled out. Malignancy is still a concern.  4.  Acute renal failure on chronic kidney disease stage IIIa. Renal function still get worse.  Increase IV fluids.  5.  Essential hypertension. Hold off blood pressure medicines.  6.  Hypothermia. Secondary to poor tissue perfusion from shock.  Bear hugger.  7.  Multiorgan failure with liver function changes. Patient has significant renal failure, liver function changes, severe lactic acidosis. Still has a poor prognosis.  Will follow closely.  Goal of care discussion with daughter.: Patient condition still very serious, prognosis in acute phase is not certain, long-term prognosis is poor.  Will decide to treat for 24 hours to get a clear picture about the patient diagnosis and prognosis.  We will discuss again tomorrow if we need go to hospice route.  Palliative care will see patient today.   1442: Patient family met with palliative care, decided on comfort care.  Referred to inpatient hospice.  Will discontinue all active treatment as well as bladder works.  DVT prophylaxis: SCDs Code Status: DNR Family Communication: Daughter  updated.  Disposition Plan:  .   Status is: Inpatient  Remains inpatient appropriate because:Inpatient level of care appropriate due to severity of illness   Dispo: The patient is from: Home              Anticipated d/c is to: Uncertain              Anticipated d/c date is: > 3 days              Patient currently is not medically stable to d/c.        I/O last 3 completed shifts: In: 2872.8 [P.O.:240; I.V.:2532.8; IV Piggyback:100] Out: 250 [Urine:250] Total I/O In: -  Out: 300 [Urine:300]     Consultants:   Palliative care  Procedures: Thoracentesis.  Antimicrobials: Unasyn and Flagyl.  Subjective: Patient is very agitated and confused.  She has significant hypothermia.  Warm blanket will be used. Spoke with nurse and sitter, patient was very confused, she was pulling her IV line and catheters. She currently is not on oxygen, does not seem to have any short of breath. Not sure she has abdominal pain, but no nausea vomiting. She still making urine with the Foley catheter in.  Objective: Vitals:   November 17, 2019 0657 2019-11-17 0815 2019-11-17 0906 17-Nov-2019 0919  BP:    (!) 100/57  Pulse:    69  Resp: (!) 24     Temp:  (!) 95.7 F (35.4 C) (!) 93.9 F (34.4 C)   TempSrc:  Rectal    SpO2:    92%  Weight:      Height:        Intake/Output Summary (Last 24 hours) at 11-17-19 0925 Last data filed at 11-17-19 4492 Gross per 24 hour  Intake 1847 ml  Output 550 ml  Net 1297 ml   Filed Weights   10/30/2019 1122  Weight: 54.4 kg    Examination:  General exam: Acutely ill, agitated confused. Respiratory system: Decreased breathing sound with some occasional tachypnea.  Cardiovascular system: Irregular, no JVD, murmurs, rubs, gallops or clicks. No pedal edema. Gastrointestinal system: Abdomen is nondistended, soft and right sided tender.  No rebound tenderness.  no organomegaly or masses felt. Normal bowel sounds heard. Central nervous system: Totally agitated  and confused. Extremities: Symmetric  Skin: No rashes, lesions or ulcers     Data Reviewed: I have personally reviewed following labs and imaging studies  CBC: Recent Labs  Lab 11/09/2019 1129 11/12/19 0535 11-17-2019 0541  WBC 6.3 13.8* 16.8*  NEUTROABS 4.8  --  14.2*  HGB 10.2* 9.3* 9.8*  HCT 35.5* 31.9* 33.4*  MCV 99.2 98.2 99.1  PLT 144* 127* 010*   Basic Metabolic Panel: Recent Labs  Lab 10/21/2019 1129 11/12/19 0535 17-Nov-2019 0541  NA 138 138 137  K 4.6 4.9 4.8  CL 99 104 106  CO2 20* 18* 16*  GLUCOSE 129* 93 100*  BUN 30* 36* 54*  CREATININE 1.70* 2.06* 2.76*  CALCIUM 8.9 8.0* 7.9*  MG 2.1  --  1.9   GFR: Estimated Creatinine Clearance: 11.4 mL/min (A) (by C-G formula based on SCr of 2.76 mg/dL (H)). Liver Function Tests: Recent Labs  Lab 11/08/2019 1129  AST 104*  ALT 53*  ALKPHOS 84  BILITOT 1.6*  PROT 7.7  ALBUMIN 3.5   No results for input(s): LIPASE, AMYLASE in the last 168 hours. No results for input(s): AMMONIA in the last 168 hours. Coagulation Profile: Recent Labs  Lab 10/20/2019 1705  INR 1.9*  Cardiac Enzymes: No results for input(s): CKTOTAL, CKMB, CKMBINDEX, TROPONINI in the last 168 hours. BNP (last 3 results) No results for input(s): PROBNP in the last 8760 hours. HbA1C: No results for input(s): HGBA1C in the last 72 hours. CBG: Recent Labs  Lab 11/12/19 0248  GLUCAP 100*   Lipid Profile: No results for input(s): CHOL, HDL, LDLCALC, TRIG, CHOLHDL, LDLDIRECT in the last 72 hours. Thyroid Function Tests: No results for input(s): TSH, T4TOTAL, FREET4, T3FREE, THYROIDAB in the last 72 hours. Anemia Panel: No results for input(s): VITAMINB12, FOLATE, FERRITIN, TIBC, IRON, RETICCTPCT in the last 72 hours. Sepsis Labs: Recent Labs  Lab 11/15/2019 1705 10/20/2019 2028 11/12/19 0217 11/12/19 0535 12/11/19 0541  PROCALCITON <0.10  --   --  0.17  --   LATICACIDVEN 6.3*  5.2* 4.7* 7.6*  --  4.4*    Recent Results (from the past 240  hour(s))  Blood culture (routine x 2)     Status: Abnormal (Preliminary result)   Collection Time: 10/31/2019 11:24 AM   Specimen: BLOOD  Result Value Ref Range Status   Specimen Description   Final    BLOOD BLOOD RIGHT FOREARM Performed at [facility], [address] [place], [place] [address]    Special Requests   Final    BOTTLES DRAWN AEROBIC AND ANAEROBIC Blood Culture adequate volume Performed at [facility], [address] [place], [place] [address]    Culture  Setup Time   Final    Organism ID to follow [place] AND ANAEROBIC BOTTLES CRITICAL RESULT CALLED TO, READ BACK BY AND VERIFIED WITH:  SHEEMA HALLAJL ON 11/12/2019 AT 1319 TIK Performed at [facility], [address], [place], [place] [address]    Culture ENTEROCOCCUS FAECALIS (A)  Final   Report Status PENDING  Incomplete  Blood Culture ID Panel (Reflexed)     Status: Abnormal   Collection Time: 10/25/2019 11:24 AM  Result Value Ref Range Status   Enterococcus faecalis DETECTED (A) NOT DETECTED Final    Comment: CRITICAL RESULT CALLED TO, READ BACK BY AND VERIFIED WITH:  SHEEMA HALLAJL ON 11/12/2019 AT 1319 TIK    Enterococcus Faecium NOT DETECTED NOT DETECTED Final   Listeria monocytogenes NOT DETECTED NOT DETECTED Final   Staphylococcus species NOT DETECTED NOT DETECTED Final   Staphylococcus aureus (BCID) NOT DETECTED NOT DETECTED Final   Staphylococcus epidermidis NOT DETECTED NOT DETECTED Final   Staphylococcus lugdunensis NOT DETECTED NOT DETECTED Final   Streptococcus species NOT DETECTED NOT DETECTED Final   Streptococcus agalactiae NOT DETECTED NOT DETECTED Final   Streptococcus pneumoniae NOT DETECTED NOT DETECTED Final   Streptococcus pyogenes NOT DETECTED NOT DETECTED Final   A.calcoaceticus-baumannii NOT DETECTED NOT DETECTED Final   Bacteroides fragilis NOT DETECTED NOT DETECTED Final   Enterobacterales NOT DETECTED NOT DETECTED Final    Enterobacter cloacae complex NOT DETECTED NOT DETECTED Final   Escherichia coli NOT DETECTED NOT DETECTED Final   Klebsiella aerogenes NOT DETECTED NOT DETECTED Final   Klebsiella oxytoca NOT DETECTED NOT DETECTED Final   Klebsiella pneumoniae NOT DETECTED NOT DETECTED Final   Proteus species NOT DETECTED NOT DETECTED Final   Salmonella species NOT DETECTED NOT DETECTED Final   Serratia marcescens NOT DETECTED NOT DETECTED Final   Haemophilus influenzae NOT DETECTED NOT DETECTED Final   Neisseria meningitidis NOT DETECTED NOT DETECTED Final   Pseudomonas aeruginosa NOT DETECTED NOT DETECTED Final   Stenotrophomonas maltophilia NOT DETECTED NOT DETECTED Final  Candida albicans NOT DETECTED NOT DETECTED Final   Candida auris NOT DETECTED NOT DETECTED Final   Candida glabrata NOT DETECTED NOT DETECTED Final   Candida krusei NOT DETECTED NOT DETECTED Final   Candida parapsilosis NOT DETECTED NOT DETECTED Final   Candida tropicalis NOT DETECTED NOT DETECTED Final   Cryptococcus neoformans/gattii NOT DETECTED NOT DETECTED Final   Vancomycin resistance NOT DETECTED NOT DETECTED Final    Comment: Performed at [facility], [address] [place], [name] [address]  Respiratory Panel by RT PCR (Flu A&B, Covid) - Nasopharyngeal Swab     Status: None   Collection Time: 11/19/2019 11:29 AM   Specimen: Nasopharyngeal Swab  Result Value Ref Range Status   SARS Coronavirus 2 by RT PCR NEGATIVE NEGATIVE Final    Comment: (NOTE) SARS-CoV-2 target nucleic acids are NOT DETECTED.  The SARS-CoV-2 RNA is generally detectable in upper respiratoy specimens during the acute phase of infection. The lowest concentration of SARS-CoV-2 viral copies this assay can detect is 131 copies/mL. A negative result does not preclude SARS-Cov-2 infection and should not be used as the sole basis for treatment or other patient management decisions. A negative result may occur with  improper specimen  collection/handling, submission of specimen other than nasopharyngeal swab, presence of viral mutation(s) within the areas targeted by this assay, and inadequate number of viral copies (<131 copies/mL). A negative result must be combined with clinical observations, patient history, and epidemiological information. The expected result is Negative.  Fact Sheet for Patients:  PinkCheek.be  Fact Sheet for Healthcare Providers:  GravelBags.it  This test is no t yet approved or cleared by the [place] FDA and  has been authorized for detection and/or diagnosis of SARS-CoV-2 by FDA under an Emergency Use Authorization (EUA). This EUA will remain  in effect (meaning this test can be used) for the duration of the COVID-19 declaration under Section 564(b)(1) of the Act, 21 U.S.C. section 360bbb-3(b)(1), unless the authorization is terminated or revoked sooner.     Influenza A by PCR NEGATIVE NEGATIVE Final   Influenza B by PCR NEGATIVE NEGATIVE Final    Comment: (NOTE) The Xpert Xpress SARS-CoV-2/FLU/RSV assay is intended as an aid in  the diagnosis of influenza from Nasopharyngeal swab specimens and  should not be used as a sole basis for treatment. Nasal washings and  aspirates are unacceptable for Xpert Xpress SARS-CoV-2/FLU/RSV  testing.  Fact Sheet for Patients: PinkCheek.be  Fact Sheet for Healthcare Providers: GravelBags.it  This test is not yet approved or cleared by the [place] FDA and  has been authorized for detection and/or diagnosis of SARS-CoV-2 by  FDA under an Emergency Use Authorization (EUA). This EUA will remain  in effect (meaning this test can be used) for the duration of the  Covid-19 declaration under Section 564(b)(1) of the Act, 21  U.S.C. section 360bbb-3(b)(1), unless the authorization is  terminated or revoked. Performed at [facility], [address] [place], [place] [address]   Blood culture (routine x 2)     Status: None (Preliminary result)   Collection Time: 11/06/2019 11:30 AM   Specimen: BLOOD  Result Value Ref Range Status   Specimen Description BLOOD LEFT ASSIST CONTROL  Final   Special Requests   Final    BOTTLES DRAWN AEROBIC AND ANAEROBIC Blood Culture adequate volume   Culture   Final    NO GROWTH 2 DAYS Performed at [facility], [address], [place], [place] [address]  Report Status PENDING  Incomplete  Urine Culture     Status: Abnormal   Collection Time: 11/10/2019  5:05 PM   Specimen: Urine, Random  Result Value Ref Range Status   Specimen Description   Final    URINE, RANDOM Performed at [facility], [address], [place], [place] [address]    Special Requests   Final    NONE Performed at [facility], [address] [place], [place] [address]    Culture (A)  Final    <10,000 COLONIES/mL INSIGNIFICANT GROWTH Performed at [facility], [place] [address], [place], [place] [address]    Report Status November 26, 2019 FINAL  Final  MRSA PCR Screening     Status: None   Collection Time: 11/12/19  3:15 AM   Specimen: Nasopharyngeal  Result Value Ref Range Status   MRSA by PCR NEGATIVE NEGATIVE Final    Comment:        The GeneXpert MRSA Assay (FDA approved for NASAL specimens only), is one component of a comprehensive MRSA colonization surveillance program. It is not intended to diagnose MRSA infection nor to guide or monitor treatment for MRSA infections. Performed at [facility], [address] [place], [place] [address]   Body fluid culture     Status: None (Preliminary result)   Collection Time: 11/12/19  9:27 AM   Specimen: PATH Cytology Pleural fluid  Result Value Ref Range Status   Specimen Description   Final    PLEURAL Performed at [facility], [address], [place], [place] [address]    Special  Requests   Final    NONE Performed at [facility], [address] [place], [place] [address]    Gram Stain   Final    RARE WBC PRESENT, PREDOMINANTLY MONONUCLEAR NO ORGANISMS SEEN    Culture   Final    NO GROWTH < 24 HOURS Performed at [facility], [place] [address], [place], [place] [address]    Report Status PENDING  Incomplete         Radiology Studies: CT ABDOMEN PELVIS WO CONTRAST  Result Date: 10/28/2019 CLINICAL DATA:  86 year old female with shortness of breath, chest and abdominal pain, and bibasilar opacities on recent chest radiograph. EXAM: CT CHEST, ABDOMEN AND PELVIS WITHOUT CONTRAST TECHNIQUE: Multidetector CT imaging of the chest, abdomen and pelvis was performed following the standard protocol without IV contrast. COMPARISON:  10/12/2019 abdomen/pelvic CT and 01/04/2009 PET CT FINDINGS: Please note that parenchymal and vascular abnormalities may be missed without intravenous contrast. CT CHEST FINDINGS Cardiovascular: Cardiomegaly and heavy coronary artery and aortic atherosclerotic calcifications are again noted. There is no evidence of thoracic aortic aneurysm or pericardial effusion. Mediastinum/Nodes: An enlarged LEFT thyroid gland is unchanged. No abnormal appearing lymph nodes are present. No mediastinal mass or hematoma identified. Lungs/Pleura: Moderate to large bilateral pleural effusions are present with mild to moderate bibasilar atelectasis. Mild ground-glass/interstitial opacities are present, greatest in the central RIGHT lung centrally. Mild biapical pleural-parenchymal scarring again not no pneumothorax noted. Musculoskeletal: Sclerosis within the T10 vertebral body appears to be reactive from adjacent Schmorl's nodes. CT ABDOMEN PELVIS FINDINGS Hepatobiliary: No hepatic or gallbladder abnormalities are identified. No definite biliary dilatation. Pancreas: Unremarkable Spleen: Unremarkable Adrenals/Urinary Tract: The kidneys and adrenal glands  are unremarkable. A Foley catheter is present within a collapsed bladder. Stomach/Bowel: Stomach is within normal limits. Appendix appears normal. No evidence of bowel wall thickening, distention, or inflammatory changes. Colonic diverticulosis noted without  evidence of acute diverticulitis. Vascular/Lymphatic: Aortic atherosclerosis. No enlarged abdominal or pelvic lymph nodes. Reproductive: Status post hysterectomy. No adnexal masses. Other: A trace amount of free fluid in the pelvis is noted. Mild subcutaneous edema is present. There is no evidence of focal collection or pneumoperitoneum. Musculoskeletal: No acute or suspicious bony abnormalities are present. IMPRESSION: 1. Moderate to large bilateral pleural effusions with mild to moderate bibasilar atelectasis, trace amount of free pelvic fluid and mild subcutaneous edema. 2. Mild ground-glass/interstitial opacities, greatest in the central RIGHT lung, which may represent mild pulmonary edema or less likely infection. 3. Cardiomegaly and coronary artery disease. 4. Aortic Atherosclerosis (ICD10-I70.0). Electronically Signed   By: Margarette Canada M.D.   On: 11/19/2019 13:08   CT Head Wo Contrast  Result Date: 11/07/2019 CLINICAL DATA:  Dizziness, altered mental status. EXAM: CT HEAD WITHOUT CONTRAST TECHNIQUE: Contiguous axial images were obtained from the base of the skull through the vertex without intravenous contrast. COMPARISON:  October 12, 2019. FINDINGS: Brain: No evidence of acute infarction, hemorrhage, hydrocephalus, extra-axial collection or mass lesion/mass effect. Vascular: No hyperdense vessel or unexpected calcification. Skull: Normal. Negative for fracture or focal lesion. Sinuses/Orbits: No acute finding. Other: None. IMPRESSION: Normal head CT. Electronically Signed   By: Marijo Conception M.D.   On: 10/24/2019 12:53   CT Chest Wo Contrast  Result Date: 11/15/2019 CLINICAL DATA:  86 year old female with shortness of breath, chest and  abdominal pain, and bibasilar opacities on recent chest radiograph. EXAM: CT CHEST, ABDOMEN AND PELVIS WITHOUT CONTRAST TECHNIQUE: Multidetector CT imaging of the chest, abdomen and pelvis was performed following the standard protocol without IV contrast. COMPARISON:  10/12/2019 abdomen/pelvic CT and 01/04/2009 PET CT FINDINGS: Please note that parenchymal and vascular abnormalities may be missed without intravenous contrast. CT CHEST FINDINGS Cardiovascular: Cardiomegaly and heavy coronary artery and aortic atherosclerotic calcifications are again noted. There is no evidence of thoracic aortic aneurysm or pericardial effusion. Mediastinum/Nodes: An enlarged LEFT thyroid gland is unchanged. No abnormal appearing lymph nodes are present. No mediastinal mass or hematoma identified. Lungs/Pleura: Moderate to large bilateral pleural effusions are present with mild to moderate bibasilar atelectasis. Mild ground-glass/interstitial opacities are present, greatest in the central RIGHT lung centrally. Mild biapical pleural-parenchymal scarring again not no pneumothorax noted. Musculoskeletal: Sclerosis within the T10 vertebral body appears to be reactive from adjacent Schmorl's nodes. CT ABDOMEN PELVIS FINDINGS Hepatobiliary: No hepatic or gallbladder abnormalities are identified. No definite biliary dilatation. Pancreas: Unremarkable Spleen: Unremarkable Adrenals/Urinary Tract: The kidneys and adrenal glands are unremarkable. A Foley catheter is present within a collapsed bladder. Stomach/Bowel: Stomach is within normal limits. Appendix appears normal. No evidence of bowel wall thickening, distention, or inflammatory changes. Colonic diverticulosis noted without evidence of acute diverticulitis. Vascular/Lymphatic: Aortic atherosclerosis. No enlarged abdominal or pelvic lymph nodes. Reproductive: Status post hysterectomy. No adnexal masses. Other: A trace amount of free fluid in the pelvis is noted. Mild subcutaneous edema  is present. There is no evidence of focal collection or pneumoperitoneum. Musculoskeletal: No acute or suspicious bony abnormalities are present. IMPRESSION: 1. Moderate to large bilateral pleural effusions with mild to moderate bibasilar atelectasis, trace amount of free pelvic fluid and mild subcutaneous edema. 2. Mild ground-glass/interstitial opacities, greatest in the central RIGHT lung, which may represent mild pulmonary edema or less likely infection. 3. Cardiomegaly and coronary artery disease. 4. Aortic Atherosclerosis (ICD10-I70.0). Electronically Signed   By: Margarette Canada M.D.   On: 11/18/2019 13:08   DG Chest Port 1 View  Result Date:  11/12/2019 CLINICAL DATA:  Status post RIGHT thoracentesis. EXAM: PORTABLE CHEST 1 VIEW COMPARISON:  Chest x-ray dated 11/12/2019. FINDINGS: Improved aeration of the RIGHT lower lung status post thoracentesis. No pneumothorax. Heart size and mediastinal contours are stable. Probable mild atelectasis and/or small pleural effusion at the LEFT lung base. IMPRESSION: 1. Improved aeration of the RIGHT lower lung status post thoracentesis. No pneumothorax. 2. Probable mild atelectasis and/or small pleural effusion at the LEFT lung base. Electronically Signed   By: Franki Cabot M.D.   On: 11/12/2019 10:59   DG Chest Portable 1 View  Result Date: 11/08/2019 CLINICAL DATA:  Weakness. EXAM: PORTABLE CHEST 1 VIEW COMPARISON:  October 16, 2019. FINDINGS: Stable cardiomediastinal silhouette. No pneumothorax is noted. Increased bibasilar opacities are noted concerning for edema with probable small bilateral pleural effusions. Bony thorax is unremarkable. IMPRESSION: Increased bibasilar opacities are noted concerning for edema with probable small bilateral pleural effusions. Aortic Atherosclerosis (ICD10-I70.0). Electronically Signed   By: Marijo Conception M.D.   On: 11/01/2019 11:52   US ABDOMEN LIMITED RUQ (LIVER/GB)  Result Date: 11/12/2019 CLINICAL DATA:  Abnormal  LFTs. EXAM: ULTRASOUND ABDOMEN LIMITED RIGHT UPPER QUADRANT COMPARISON:  CT 11/09/2019 FINDINGS: Gallbladder: Gallbladder is physiologically distended containing multiple calculi. No wall thickening. No pericholecystic fluid. Sonographer reports no sonographic Murphy's sign. Common bile duct: Diameter: 4.2 mm, unremarkable Liver: No focal lesion identified. Within normal limits in parenchymal echogenicity. Portal vein is patent on color Doppler imaging with normal direction of blood flow towards the liver. Other: None. IMPRESSION: Cholelithiasis without other ultrasound evidence of cholecystitis or biliary obstruction. Electronically Signed   By: Lucrezia Europe M.D.   On: 11/12/2019 11:48   US THORACENTESIS ASP PLEURAL SPACE W/IMG GUIDE  Result Date: 11/12/2019 INDICATION: Patient with history of COPD, vaginal cancer, atrial fibrillation, dyspnea, bilateral pleural effusions, chronic kidney disease; request received for diagnostic and therapeutic right thoracentesis. EXAM: ULTRASOUND GUIDED DIAGNOSTIC AND THERAPEUTIC RIGHT THORACENTESIS MEDICATIONS: 1% lidocaine to skin and subcutaneous tissue COMPLICATIONS: None immediate.  No pneumothorax on follow-up chest radiograph. PROCEDURE: An ultrasound guided thoracentesis was thoroughly discussed with the patient's daughter and questions answered. The benefits, risks, alternatives and complications were also discussed. The patient understands and wishes to proceed with the procedure. Written consent was obtained. Ultrasound was performed to localize and mark an adequate pocket of fluid in the right chest. The area was then prepped and draped in the normal sterile fashion. 1% Lidocaine was used for local anesthesia. Under ultrasound guidance a 6 Fr Safe-T-Centesis catheter was introduced. Thoracentesis was performed. The catheter was removed and a dressing applied. FINDINGS: A total of approximately 900 cc of amber fluid was removed. Samples were sent to the laboratory as  requested by the clinical team. IMPRESSION: Successful ultrasound guided diagnostic and therapeutic right thoracentesis yielding 900 cc of pleural fluid. Read by: Rowe Robert, PA-C Electronically Signed   By: Lucrezia Europe M.D.   On: 11/12/2019 09:26        Scheduled Meds: . aspirin EC  81 mg Oral QODAY  . Chlorhexidine Gluconate Cloth  6 each Topical Daily  . conjugated estrogens  1 Applicatorful Vaginal Daily  . lactulose  20 g Oral Once  . methimazole  5 mg Oral Daily  . mirtazapine  15 mg Oral QHS  . pantoprazole  40 mg Oral Daily  . QUEtiapine  25 mg Oral QHS   Continuous Infusions: . sodium chloride Stopped (26-Nov-2019 0505)  . ampicillin-sulbactam (UNASYN) IV 3 g (11/26/19 0505)  .  metronidazole       LOS: 2 days    Time spent: 46 minutes    Sharen Hones, MD [facility]   To contact the attending provider between 7A-7P or the covering provider during after hours 7P-7A, please log into the web site [email or web address] and access using universal [place] password for that web site. If you do not have the password, please call the hospital operator.  2019/12/02, 9:25 AM

## 2019-11-20 NOTE — TOC Initial Note (Signed)
Transition of Care [facility]) - Initial/Assessment Note    Patient Details  Name: Kristin Rowe MRN: [id number] Date of Birth: 1926/06/28  Transition of Care [facility]) CM/SW Contact:    Shelbie Hutching, RN Phone Number: 11-21-19, 11:20 AM  Clinical Narrative:                 Patient admitted to the hospital with severe sepsis and septic shock.  Patient discharged from the hospital in September to [place] for [facility].  Daughter, Butch Penny is at the bedside this morning and reports that patient did really well for the 10 days that she was in [facility].  Patient discharged home from [facility] and but shortly after going home started to decline, sleeping most of the time, not eating, getting weaker and weaker, and mental status declining.  Patient at this time is agitated, not oriented, unintelligible speech, daughter reports that this is very far from her baseline.  Daughter is overwhelmed and does not know what to do- she reports that she and her sister cannot care for the patient like this at home.  Palliative care consult pending poor prognosis.   TOC team will cont to follow.    Expected Discharge Plan:  (undetermined) Barriers to Discharge: Continued Medical Work up   Patient Goals and CMS Choice Patient states their goals for this hospitalization and ongoing recovery are:: Patient's daughter's are concerned and overwhelmed with patient's condition      Expected Discharge Plan and Services Expected Discharge Plan:  (undetermined)   Discharge Planning Services: CM Consult   Living arrangements for the past 2 months: Single Family Home                                      Prior Living Arrangements/Services Living arrangements for the past 2 months: Single Family Home Lives with:: Self Patient language and need for interpreter reviewed:: Yes        Need for Family Participation in Patient Care: Yes (Comment) (sepsis) Care giver support system in place?: Yes  (comment) (daughters) Current home services: DME (walker) Criminal Activity/Legal Involvement Pertinent to Current Situation/Hospitalization: No - Comment as needed  Activities of Daily Living      Permission Sought/Granted Permission sought to share information with : Case Manager, Family Supports Permission granted to share information with : Yes, Verbal Permission Granted  Share Information with NAME: Butch Penny and [name]     Permission granted to share info w Relationship: daughters     Emotional Assessment Appearance:: Appears stated age Attitude/Demeanor/Rapport: Other (comment) (confused) Affect (typically observed): Agitated   Alcohol / Substance Use: Not Applicable Psych Involvement: No (comment)  Admission diagnosis:  Pleural effusion [J90] Severe sepsis with septic shock ([place]) [A41.9, R65.21] AKI (acute kidney injury) ([place]) [N17.9] Atrial fibrillation with RVR ([place]) [I48.91] Abnormal LFTs [R94.5] Hypothermia, initial encounter [T68.XXXA] Sepsis, due to unspecified organism, unspecified whether acute organ dysfunction present [facility]) [A41.9] Patient Active Problem List   Diagnosis Date Noted  . Acute metabolic encephalopathy 40/98/1191  . Severe sepsis with septic shock ([place]) 11/10/2019  . HCAP (healthcare-associated pneumonia) 11/19/2019  . Bilateral pleural effusion 11/01/2019  . Atrial fibrillation, chronic ([place]) 11/12/2019  . Acute renal failure superimposed on stage 3a chronic kidney disease ([place]) 11/10/2019  . Abnormal LFTs 10/29/2019  . Depression 11/15/2019  . Hypothermia   . Hyperthyroidism   . Sepsis ([place]) 10/12/2019  . Acute pyelonephritis  10/12/2019  . Atrial fibrillation with RVR ([place]) 10/12/2019  . Elevated troponin 10/12/2019  . Anemia 10/12/2019  . Cervical cancer ([place]) 01/09/2018  . Thyroid disease 01/09/2018  . Venous stasis 01/09/2018  . Melanoma in situ of eyelid, left ([place]) 03/02/2017  . Essential hypertension 11/01/2015  . Carotid  stenosis, asymptomatic 10/17/2015  . Atypical chest pain 09/30/2015  . PVD (peripheral vascular disease) ([place]) 09/30/2015  . Dizziness 09/25/2015  . Osteoarthritis 10/20/2013  . Osteoporosis 10/20/2013  . Melanoma in situ ([place]) 04/20/2007   PCP:  [name], MD Pharmacy:   [id number] - [place], [place] - [address] [place] [address] Phone: [phone] Fax: [phone]     Social Determinants of Health (SDOH) Interventions    Readmission Risk Interventions No flowsheet data found.

## 2019-11-20 NOTE — Consult Note (Signed)
Consultation Note Date: 11/23/2019   Patient Name: Kristin Rowe  DOB: 04-03-1926  MRN: [id number]  Age / Sex: 86 y.o., female   PCP: [name], MD Referring Physician: Sharen Hones, MD   REASON FOR CONSULTATION:Establishing goals of care  Palliative Care consult requested for goals of care discussion in this 86 y.o. female with multiple medical problems including hypertension, COPD, GERD, depression, anxiety, PVD, vaginal cancer (s/p of radiation therapy, vaginectomy, hysterectomy), A. fib not on anticoagulants, hyperthyroidism, carotid artery stenosis (s/p for endarterectomy), recent treatment for pyelonephritis, and CKD-3. Patient presented to ED from home with complaints of shortness of breath and generalized weakness. During ED work-up patient received 1L bolus in the setting of hypotension. Worsening renal function, abnormal liver function, lactic acid 10.3, tacycardic (130s), and hypothermic. Head CT negative for acute abnormalities. CT of chest showed moderate to large bilateral pleural effusions, ground glass opacities, and cardiomegaly. Since admission patient underwent right thoracentesis which yielded 900 ml of amber colored fluid. Continues to exhibit increased agitation and confusion.   Clinical Assessment and Goals of Care: I have reviewed medical records including lab results, imaging, Epic notes, and MAR, received report from the bedside RN, and assessed the patient. I met at the bedside with patient's daughters Butch Penny and [name]  to discuss diagnosis prognosis, [place], EOL wishes, disposition and options.  Patient awake in bed. Confused x3. Does not recognize daughters. Pulling at covers, attempting to get out of bed, fidgeting with occasional moaning noted. Restless. Sitter is at the bedside.   Mrs. Higinbotham and her family are familiar to myself and Palliative from previous admissions and our involvement. I re-introduced Palliative Medicine and our role in [name]'s care  to while hospitalized to her daughters. Both Butch Penny and [name] verbalized their appreciation of continued support.   Daughters share patient did well for a brief period at rehab s/p previous discharge in September. Family shares she returned home only to continue to decline at a rapid pace. Daughters share she had a poor appetite, increased confusion, required increased assistance with ADLs, with generalized weakness making mobility difficult.   We discussed Her current illness and what it means in the larger context of Her on-going co-morbidities.  Natural disease trajectory and expectations at EOL were discussed. Family verbalizes understanding of her current illness and co-morbidities.   They share although they have agreed to medical interventions they have discussed amongst themselves over the past 24hrs and they do not wish to continue with aggressive treatments or interventions at this point in her life.   Dr. Ubaldo Glassing also arrives at the bedside and offer updates, recommendations, and risk involved with aggressive medical interventions. Family declines any further cardiac interventions.   Both Butch Penny and [name] are tearful expressing their awareness of her decline and significant decrease in her quality of life. They share their love for their mother and knowing she would not want to live in her current state of health. They feel she is suffering and be allowed to rest peacefully with a natural dying process.   I attempted to elicit values and goals of care important to the patient.    The difference between aggressive medical intervention and comfort care was considered in light of the patient's goals of care. I educated family on what comfort care measures would look like for [name].   Daughter expresses wishes to transition all care to focus on her comfort, symptom management, and allowing family to spend what time patient has  with her without interventions that will only prolong her life.    Family confirms DNR/DNI and again confirms wishes for full comfort.   Hospice  services outpatient were explained and offered. Butch Penny and [name] verbalized their understanding and awareness of hospice's goals and philosophy of care.  We discussed options of returning home with hospice support versus residential hospice facility. Family states they are unable to physically and emotionally provide the care they know their mother would need and would like a referral to be placed with the hospice home in [place]. Support provided. Education provided on the referral process and the transfer of patient once approved and bed is available.   Daughters also requesting Spiritual support.   Questions and concerns were addressed. The family was encouraged to call with questions or concerns.  PMT will continue to support holistically.   SOCIAL HISTORY:     reports that she quit smoking about 34 years ago. She has never used smokeless tobacco. She reports that she does not drink alcohol and does not use drugs.  CODE STATUS: DNR  ADVANCE DIRECTIVES: Daughters   SYMPTOM MANAGEMENT: see below  Palliative Prophylaxis:   Aspiration, Delirium Protocol, Frequent Pain Assessment, Oral Care and Turn Reposition  PSYCHO-SOCIAL/SPIRITUAL:  Support System: Family Desire for further Chaplaincy support YES   Additional Recommendations (Limitations, Scope, Preferences):  Full Comfort Care  Education on hospice   PAST MEDICAL HISTORY: Past Medical History:  Diagnosis Date  . Anxiety   . Arthritis   . Back pain    chronic  . Cancer [facility])    Vaginal - radiation treatments/mohs for melanoma left eyelid  . COPD (chronic obstructive pulmonary disease) ([place])   . Dizziness    in am  . Heart murmur   . Hypertension   . Hyperthyroidism   . Peripheral vascular disease (H[facility])     ALLERGIES:  is allergic to daypro [oxaprozin], morphine and related, plaquenil [hydroxychloroquine sulfate], and ridaura  [auranofin].   MEDICATIONS:  Current Facility-Administered Medications  Medication Dose Route Frequency Provider Last Rate Last Admin  . 0.9 %  sodium chloride infusion   Intravenous Continuous Sharen Hones, MD 150 mL/hr at 11-23-2019 1023 Rate Change at 11/23/2019 1023  . albuterol (PROVENTIL) (2.5 MG/3ML) 0.083% nebulizer solution 2.5 mg  2.5 mg Nebulization Q4H PRN Ivor Costa, MD      . ALPRAZolam Duanne Moron) tablet 0.25 mg  0.25 mg Oral Daily PRN Ivor Costa, MD   0.25 mg at 11/12/19 1531  . ampicillin (OMNIPEN) 2 g in sodium chloride 0.9 % 100 mL IVPB  2 g Intravenous Q12H Ravishankar, Joellyn Quails, MD      . aspirin EC tablet 81 mg  81 mg Oral Henreitta Cea, MD   81 mg at 11-23-19 0846  . cefTRIAXone (ROCEPHIN) 2 g in sodium chloride 0.9 % 100 mL IVPB  2 g Intravenous Q12H Tsosie Billing, MD      . Chlorhexidine Gluconate Cloth 2 % PADS 6 each  6 each Topical Daily Sharion Settler, NP   6 each at 11/23/19 0847  . conjugated estrogens (PREMARIN) vaginal cream 1 Applicatorful  1 Applicatorful Vaginal Daily Ivor Costa, MD   1 Applicatorful at 86/57/84 0850  . dextromethorphan-guaiFENesin (MUCINEX DM) 30-600 MG per 12 hr tablet 1 tablet  1 tablet Oral BID PRN Ivor Costa, MD      . lactulose (CHRONULAC) 10 GM/15ML solution 20 g  20 g Oral Once Sharen Hones, MD      . methimazole (TAPAZOLE) tablet  5 mg  5 mg Oral Daily Ivor Costa, MD   5 mg at 2019/11/17 0846  . metroNIDAZOLE (FLAGYL) IVPB 500 mg  500 mg Intravenous Redmond Pulling, MD 100 mL/hr at 11/17/2019 1023 500 mg at 2019-11-17 1023  . mirtazapine (REMERON) tablet 15 mg  15 mg Oral QHS Ivor Costa, MD   15 mg at 11/12/19 2320  . ondansetron (ZOFRAN) injection 4 mg  4 mg Intravenous Q8H PRN Ivor Costa, MD      . pantoprazole (PROTONIX) EC tablet 40 mg  40 mg Oral Daily Ivor Costa, MD   40 mg at 11/17/2019 0847  . QUEtiapine (SEROQUEL) tablet 25 mg  25 mg Oral QHS Zhang, Dekui, MD      . sodium chloride 0.9 % bolus 1,000 mL  1,000 mL Intravenous  Once Sharen Hones, MD        VITAL SIGNS: BP (!) 107/48   Pulse 83   Temp (!) 95.4 F (35.2 C) (Rectal)   Resp 18   Ht _0  (1.626 m)   Wt 54.4 kg   SpO2 93%   BMI 20.60 kg/m  Filed Weights   11/05/2019 1122  Weight: 54.4 kg    Estimated body mass index is 20.6 kg/m as calculated from the following:   Height as of this encounter: _1  (1.626 m).   Weight as of this encounter: 54.4 kg.  LABS: CBC:    Component Value Date/Time   WBC 16.8 (H) 2019/11/17 0541   HGB 9.8 (L) Nov 17, 2019 0541   HCT 33.4 (L) 11-17-2019 0541   PLT 123 (L) 2019/11/17 0541   Comprehensive Metabolic Panel:    Component Value Date/Time   NA 137 2019-11-17 0541   K 4.8 11-17-2019 0541   BUN 54 (H) 11/17/19 0541   BUN 21 12/02/2016 1526   CREATININE 2.76 (H) 11-17-2019 0541   ALBUMIN 3.5 11/02/2019 1129     Review of Systems  Unable to perform ROS: Dementia   Physical Exam General: restless, confused. chronically-ill appearing Cardiovascular: irregular, tachycardic Pulmonary: diminished bilaterally, coarse  Neurological: confused, agitated, will not follow commands  Prognosis: Hours - Days  Discharge Planning:  Hospice facility vs. Anticipated hospital death  Recommendations: . DNR/DNI-as confirmed by daughters . Transition all care to focus on comfort/symptom management . Daughters clear in expressed wishes for no further medical interventions. Focus on symptom management/comfort, peaceful natural death. . Will discontinue all orders not focused on comfort. Morphine PRN for pain/air hunger/comfort Robinul PRN for excessive secretions Ativan PRN for agitation/anxiety Zofran PRN for nausea Liquifilm tears PRN for dry eyes Haldol PRN for agitation/anxiety May have comfort feeding Comfort cart for family Unrestricted visitations in the setting of EOL (per policy) Oxygen PRN 2L or less for comfort. No escalation.  PMT will continue to support and follow. Please call team line  with urgent needs.   Palliative Performance Scale: PPS 20%              Family expressed understanding and was in agreement with this plan.   Thank you for allowing the Palliative Medicine Team to assist in the care of this patient.  Time In: 1155 Time Out: 1310 Time Total: 75 min.   Visit consisted of counseling and education dealing with the complex and emotionally intense issues of symptom management and palliative care in the setting of serious and potentially life-threatening illness.Greater than 50%  of this time was spent counseling and coordinating care related to the above assessment and plan.  Signed by:  Alda Lea, AGPCNP-BC Palliative Medicine Team  Phone: [phone] Pager: [phone] Amion: Bjorn Pippin

## 2019-11-20 NOTE — Consult Note (Signed)
NAME: Kristin Rowe  DOB: 10-25-1926  MRN: [id number]  Date/Time: 2019/11/28 9:53 AM  REQUESTING PROVIDER zhang Subjective:  REASON FOR CONSULT: bacteremia ?chart reviewed spoke to family- pt obtunded Kristin Rowe is a 86 y.o. female with a history of new Afib, anemia, HTn, hyperthyroidism Admitted from home brought in by EMS on 11/15/2019 because of weakness, fatigue, poor appetite, some abdominal discomfort Pt was in [facility] between 9/23-9/28/21 for similar complaints and treated for pyelonephritis, Afib with RVR, elevated troponin, anemia She was sent to Rehab and got discharged home 2 weeks ago.  In the ED vitals were temp of 92.7, BP 88/71, HR 126, rr 25, pulse ox 97% Labs revealed wbc 6.3, Hb 10.2, PLT 144. Cr 1.70 ( was 0.79) AST 104, ALT 53, Bili 1.6, lactate 10.3, procal < 0.10 Blood culture sent CT chest and abdomen revealed moderate to large pleural effusion b/l, moderate bibasilar atelectasis and mild gound glas sin the rt lung Cardiomegaly and CAD CT abdomen did not reveal any abnormality ( except colonic  diverticulosis  Without inflammation Blood culture positive for enterococcus and I am seeing  the patient   Past Medical History:  Diagnosis Date  . Anxiety   . Arthritis   . Back pain    chronic  . Cancer [facility])    Vaginal - radiation treatments/mohs for melanoma left eyelid  . COPD (chronic obstructive pulmonary disease) ([place])   . Dizziness    in am  . Heart murmur   . Hypertension   . Hyperthyroidism   . Peripheral vascular disease [facility])     Past Surgical History:  Procedure Laterality Date  . ABDOMINAL HYSTERECTOMY    . ENDARTERECTOMY Right 10/17/2015   Procedure: ENDARTERECTOMY CAROTID;  Surgeon: Algernon Huxley, MD;  Location: [facility] ORS;  Service: Vascular;  Laterality: Right;  . MOHS SURGERY    . vaginectomy     partial    Social History   Socioeconomic History  . Marital status: Widowed    Spouse name: Not on file  . Number of children: Not on file   . Years of education: Not on file  . Highest education level: Not on file  Occupational History  . Not on file  Tobacco Use  . Smoking status: Former Smoker    Quit date: 10/09/1985    Years since quitting: 34.1  . Smokeless tobacco: Never Used  Vaping Use  . Vaping Use: Never used  Substance and Sexual Activity  . Alcohol use: No  . Drug use: No  . Sexual activity: Never  Other Topics Concern  . Not on file  Social History Narrative  . Not on file   Social Determinants of Health   Financial Resource Strain:   . Difficulty of Paying Living Expenses: Not on file  Food Insecurity:   . Worried About Charity fundraiser in the Last Year: Not on file  . Ran Out of Food in the Last Year: Not on file  Transportation Needs:   . Lack of Transportation (Medical): Not on file  . Lack of Transportation (Non-Medical): Not on file  Physical Activity:   . Days of Exercise per Week: Not on file  . Minutes of Exercise per Session: Not on file  Stress:   . Feeling of Stress : Not on file  Social Connections:   . Frequency of Communication with Friends and Family: Not on file  . Frequency of Social Gatherings with Friends and Family: Not on file  .  Attends Religious Services: Not on file  . Active Member of Clubs or Organizations: Not on file  . Attends Archivist Meetings: Not on file  . Marital Status: Not on file  Intimate Partner Violence:   . Fear of Current or Ex-Partner: Not on file  . Emotionally Abused: Not on file  . Physically Abused: Not on file  . Sexually Abused: Not on file    Family History  Problem Relation Age of Onset  . Kidney disease Sister   . Kidney Stones Sister   . Kidney cancer Neg Hx   . Prostate cancer Neg Hx    Allergies  Allergen Reactions  . Daypro [Oxaprozin] Other (See Comments)    gi  . Morphine And Related Nausea And Vomiting  . Plaquenil [Hydroxychloroquine Sulfate]     Pt denies  . Ridaura [Auranofin] Other (See Comments)     denies    ? Current Facility-Administered Medications  Medication Dose Route Frequency Provider Last Rate Last Admin  . 0.9 %  sodium chloride infusion   Intravenous Continuous Sharen Hones, MD   Stopped at 28-Nov-2019 0505  . albuterol (PROVENTIL) (2.5 MG/3ML) 0.083% nebulizer solution 2.5 mg  2.5 mg Nebulization Q4H PRN Ivor Costa, MD      . ALPRAZolam Duanne Moron) tablet 0.25 mg  0.25 mg Oral Daily PRN Ivor Costa, MD   0.25 mg at 11/12/19 1531  . Ampicillin-Sulbactam (UNASYN) 3 g in sodium chloride 0.9 % 100 mL IVPB  3 g Intravenous Q12H Sharen Hones, MD 200 mL/hr at 2019-11-28 0505 3 g at 11-28-2019 0505  . aspirin EC tablet 81 mg  81 mg Oral Henreitta Cea, MD   81 mg at Nov 28, 2019 0846  . Chlorhexidine Gluconate Cloth 2 % PADS 6 each  6 each Topical Daily Sharion Settler, NP   6 each at 2019-11-28 0847  . conjugated estrogens (PREMARIN) vaginal cream 1 Applicatorful  1 Applicatorful Vaginal Daily Ivor Costa, MD   1 Applicatorful at 51/76/16 0850  . dextromethorphan-guaiFENesin (MUCINEX DM) 30-600 MG per 12 hr tablet 1 tablet  1 tablet Oral BID PRN Ivor Costa, MD      . lactulose (CHRONULAC) 10 GM/15ML solution 20 g  20 g Oral Once Sharen Hones, MD      . methimazole (TAPAZOLE) tablet 5 mg  5 mg Oral Daily Ivor Costa, MD   5 mg at 11-28-19 0846  . metroNIDAZOLE (FLAGYL) IVPB 500 mg  500 mg Intravenous Q8H Zhang, Danford Bad, MD      . mirtazapine (REMERON) tablet 15 mg  15 mg Oral QHS Ivor Costa, MD   15 mg at 11/12/19 2320  . ondansetron (ZOFRAN) injection 4 mg  4 mg Intravenous Q8H PRN Ivor Costa, MD      . pantoprazole (PROTONIX) EC tablet 40 mg  40 mg Oral Daily Ivor Costa, MD   40 mg at Nov 28, 2019 0847  . QUEtiapine (SEROQUEL) tablet 25 mg  25 mg Oral QHS Sharen Hones, MD         Abtx:  Anti-infectives (From admission, onward)   Start     Dose/Rate Route Frequency Ordered Stop   Nov 28, 2019 1230  vancomycin (VANCOREADY) IVPB 750 mg/150 mL  Status:  Discontinued        750 mg 150 mL/hr over 60 Minutes  Intravenous Every 48 hours 11/05/2019 1452 11/12/19 1057   11-28-19 1000  metroNIDAZOLE (FLAGYL) IVPB 500 mg        500 mg 100 mL/hr over 60 Minutes Intravenous  Every 8 hours November 27, 2019 0923     11/12/19 1600  Ampicillin-Sulbactam (UNASYN) 3 g in sodium chloride 0.9 % 100 mL IVPB        3 g 200 mL/hr over 30 Minutes Intravenous Every 12 hours 11/12/19 1353     11/12/19 1230  ceFEPIme (MAXIPIME) 2 g in sodium chloride 0.9 % 100 mL IVPB  Status:  Discontinued        2 g 200 mL/hr over 30 Minutes Intravenous Every 24 hours 11/01/2019 1452 11/12/19 1353   11/18/2019 1145  ceFEPIme (MAXIPIME) 1 g in sodium chloride 0.9 % 100 mL IVPB        1 g 200 mL/hr over 30 Minutes Intravenous  Once 11/12/2019 1137 10/30/2019 1303   10/23/2019 1145  vancomycin (VANCOCIN) IVPB 1000 mg/200 mL premix        1,000 mg 200 mL/hr over 60 Minutes Intravenous  Once 11/05/2019 1144 11/17/2019 1334      REVIEW OF SYSTEMS:  NA Objective:  VITALS:  BP (!) 100/57   Pulse 69   Temp (!) 93.9 F (34.4 C)   Resp (!) 24 Comment: counted manually for 1 full minute   Ht 5\' 4"  (1.626 m)   Wt 54.4 kg   SpO2 92%   BMI 20.60 kg/m  PHYSICAL EXAM:  General: obtunded Frail  Involuntary tremors rt arm Head: Normocephalic, without obvious abnormality, atraumatic. Eyes: closed ENT Nares normal. No drainage or sinus tenderness. Lips, mucosa, and tongue normal. No Thrush- dentures Neck: Supple,  Lungsb/l air entry Heart: irregular Abdomen: Soft, non-tender,not distended. Bowel sounds normal. No masses Extremities: atraumatic, no cyanosis. No edema. No clubbing Skin: easy bruising. Neurologic: cannot assess Pertinent Labs Lab Results CBC    Component Value Date/Time   WBC 16.8 (H) 11/27/2019 0541   RBC 3.37 (L) 11-27-2019 0541   HGB 9.8 (L) 11-27-2019 0541   HCT 33.4 (L) 11-27-19 0541   PLT 123 (L) 2019-11-27 0541   MCV 99.1 11-27-2019 0541   MCH 29.1 11-27-2019 0541   MCHC 29.3 (L) 2019/11/27 0541   RDW 20.0 (H)  11-27-19 0541   LYMPHSABS 0.4 (L) 2019/11/27 0541   MONOABS 1.9 (H) 11/27/2019 0541   EOSABS 0.0 11-27-2019 0541   BASOSABS 0.0 2019/11/27 0541    CMP Latest Ref Rng & Units 2019/11/27 11/12/2019 11/14/2019  Glucose 70 - 99 mg/dL 100(H) 93 129(H)  BUN 8 - 23 mg/dL 54(H) 36(H) 30(H)  Creatinine 0.44 - 1.00 mg/dL 2.76(H) 2.06(H) 1.70(H)  Sodium 135 - 145 mmol/L 137 138 138  Potassium 3.5 - 5.1 mmol/L 4.8 4.9 4.6  Chloride 98 - 111 mmol/L 106 104 99  CO2 22 - 32 mmol/L 16(L) 18(L) 20(L)  Calcium 8.9 - 10.3 mg/dL 7.9(L) 8.0(L) 8.9  Total Protein 6.5 - 8.1 g/dL - - 7.7  Total Bilirubin 0.3 - 1.2 mg/dL - - 1.6(H)  Alkaline Phos 38 - 126 U/L - - 84  AST 15 - 41 U/L - - 104(H)  ALT 0 - 44 U/L - - 53(H)      Microbiology: Recent Results (from the past 240 hour(s))  Blood culture (routine x 2)     Status: Abnormal (Preliminary result)   Collection Time: 10/29/2019 11:24 AM   Specimen: BLOOD  Result Value Ref Range Status   Specimen Description   Final    BLOOD BLOOD RIGHT FOREARM Performed at [facility], [address] [place], [place] [address]    Special Requests   Final  BOTTLES DRAWN AEROBIC AND ANAEROBIC Blood Culture adequate volume Performed at [facility], [address] [place], [place] [address]    Culture  Setup Time   Final    Organism ID to follow GRAM POSITIVE COCCI IN BOTH AEROBIC AND ANAEROBIC BOTTLES CRITICAL RESULT CALLED TO, READ BACK BY AND VERIFIED WITH:  SHEEMA HALLAJL ON 11/12/2019 AT 1319 TIK Performed at [facility], [address] [place], [place] [address]    Culture ENTEROCOCCUS FAECALIS (A)  Final   Report Status PENDING  Incomplete  Blood Culture ID Panel (Reflexed)     Status: Abnormal   Collection Time: 11/05/2019 11:24 AM  Result Value Ref Range Status   Enterococcus faecalis DETECTED (A) NOT DETECTED Final    Comment: CRITICAL RESULT CALLED TO, READ BACK BY AND VERIFIED WITH:  SHEEMA HALLAJL ON  11/12/2019 AT 1319 TIK    Enterococcus Faecium NOT DETECTED NOT DETECTED Final   Listeria monocytogenes NOT DETECTED NOT DETECTED Final   Staphylococcus species NOT DETECTED NOT DETECTED Final   Staphylococcus aureus (BCID) NOT DETECTED NOT DETECTED Final   Staphylococcus epidermidis NOT DETECTED NOT DETECTED Final   Staphylococcus lugdunensis NOT DETECTED NOT DETECTED Final   Streptococcus species NOT DETECTED NOT DETECTED Final   Streptococcus agalactiae NOT DETECTED NOT DETECTED Final   Streptococcus pneumoniae NOT DETECTED NOT DETECTED Final   Streptococcus pyogenes NOT DETECTED NOT DETECTED Final   A.calcoaceticus-baumannii NOT DETECTED NOT DETECTED Final   Bacteroides fragilis NOT DETECTED NOT DETECTED Final   Enterobacterales NOT DETECTED NOT DETECTED Final   Enterobacter cloacae complex NOT DETECTED NOT DETECTED Final   Escherichia coli NOT DETECTED NOT DETECTED Final   Klebsiella aerogenes NOT DETECTED NOT DETECTED Final   Klebsiella oxytoca NOT DETECTED NOT DETECTED Final   Klebsiella pneumoniae NOT DETECTED NOT DETECTED Final   Proteus species NOT DETECTED NOT DETECTED Final   Salmonella species NOT DETECTED NOT DETECTED Final   Serratia marcescens NOT DETECTED NOT DETECTED Final   Haemophilus influenzae NOT DETECTED NOT DETECTED Final   Neisseria meningitidis NOT DETECTED NOT DETECTED Final   Pseudomonas aeruginosa NOT DETECTED NOT DETECTED Final   Stenotrophomonas maltophilia NOT DETECTED NOT DETECTED Final   Candida albicans NOT DETECTED NOT DETECTED Final   Candida auris NOT DETECTED NOT DETECTED Final   Candida glabrata NOT DETECTED NOT DETECTED Final   Candida krusei NOT DETECTED NOT DETECTED Final   Candida parapsilosis NOT DETECTED NOT DETECTED Final   Candida tropicalis NOT DETECTED NOT DETECTED Final   Cryptococcus neoformans/gattii NOT DETECTED NOT DETECTED Final   Vancomycin resistance NOT DETECTED NOT DETECTED Final    Comment: Performed at [facility], [address] [place], [place] [address]  Respiratory Panel by RT PCR (Flu A&B, Covid) - Nasopharyngeal Swab     Status: None   Collection Time: 11/16/2019 11:29 AM   Specimen: Nasopharyngeal Swab  Result Value Ref Range Status   SARS Coronavirus 2 by RT PCR NEGATIVE NEGATIVE Final    Comment: (NOTE) SARS-CoV-2 target nucleic acids are NOT DETECTED.  The SARS-CoV-2 RNA is generally detectable in upper respiratoy specimens during the acute phase of infection. The lowest concentration of SARS-CoV-2 viral copies this assay can detect is 131 copies/mL. A negative result does not preclude SARS-Cov-2 infection and should not be used as the sole basis for treatment or other patient management decisions. A negative result may occur with  improper specimen collection/handling, submission of specimen other than nasopharyngeal swab, presence of viral mutation(s)  within the areas targeted by this assay, and inadequate number of viral copies (<131 copies/mL). A negative result must be combined with clinical observations, patient history, and epidemiological information. The expected result is Negative.  Fact Sheet for Patients:  PinkCheek.be  Fact Sheet for Healthcare Providers:  GravelBags.it  This test is no t yet approved or cleared by the [place] FDA and  has been authorized for detection and/or diagnosis of SARS-CoV-2 by FDA under an Emergency Use Authorization (EUA). This EUA will remain  in effect (meaning this test can be used) for the duration of the COVID-19 declaration under Section 564(b)(1) of the Act, 21 U.S.C. section 360bbb-3(b)(1), unless the authorization is terminated or revoked sooner.     Influenza A by PCR NEGATIVE NEGATIVE Final   Influenza B by PCR NEGATIVE NEGATIVE Final    Comment: (NOTE) The Xpert Xpress SARS-CoV-2/FLU/RSV assay is intended as an aid in  the diagnosis of influenza from  Nasopharyngeal swab specimens and  should not be used as a sole basis for treatment. Nasal washings and  aspirates are unacceptable for Xpert Xpress SARS-CoV-2/FLU/RSV  testing.  Fact Sheet for Patients: PinkCheek.be  Fact Sheet for Healthcare Providers: GravelBags.it  This test is not yet approved or cleared by the [place] FDA and  has been authorized for detection and/or diagnosis of SARS-CoV-2 by  FDA under an Emergency Use Authorization (EUA). This EUA will remain  in effect (meaning this test can be used) for the duration of the  Covid-19 declaration under Section 564(b)(1) of the Act, 21  U.S.C. section 360bbb-3(b)(1), unless the authorization is  terminated or revoked. Performed at [facility], [address] [place], [place] [address]   Blood culture (routine x 2)     Status: None (Preliminary result)   Collection Time: 11/02/2019 11:30 AM   Specimen: BLOOD  Result Value Ref Range Status   Specimen Description BLOOD LEFT ASSIST CONTROL  Final   Special Requests   Final    BOTTLES DRAWN AEROBIC AND ANAEROBIC Blood Culture adequate volume   Culture   Final    NO GROWTH 2 DAYS Performed at [facility], [address], [place], [place] [address]    Report Status PENDING  Incomplete  Urine Culture     Status: Abnormal   Collection Time: 10/31/2019  5:05 PM   Specimen: Urine, Random  Result Value Ref Range Status   Specimen Description   Final    URINE, RANDOM Performed at [facility], [address], [place], [place] [address]    Special Requests   Final    NONE Performed at [facility], [address], [place], [place] [address]    Culture (A)  Final    <10,000 COLONIES/mL INSIGNIFICANT GROWTH Performed at [facility], [place] [address], [place],  [address]    Report Status 12-12-2019 FINAL  Final  MRSA PCR Screening     Status: None   Collection  Time: 11/12/19  3:15 AM   Specimen: Nasopharyngeal  Result Value Ref Range Status   MRSA by PCR NEGATIVE NEGATIVE Final    Comment:        The GeneXpert MRSA Assay (FDA approved for NASAL specimens only), is one component of a comprehensive MRSA colonization surveillance program. It is not intended to diagnose MRSA infection nor to guide or monitor treatment for MRSA infections. Performed at [facility], [address], [place],  [address]   Body fluid culture  Status: None (Preliminary result)   Collection Time: 11/12/19  9:27 AM   Specimen: PATH Cytology Pleural fluid  Result Value Ref Range Status   Specimen Description   Final    PLEURAL Performed at [facility], [address], [place], [place] [address]    Special Requests   Final    NONE Performed at [facility], [address] [place], [place] [address]    Gram Stain   Final    RARE WBC PRESENT, PREDOMINANTLY MONONUCLEAR NO ORGANISMS SEEN    Culture   Final    NO GROWTH < 24 HOURS Performed at [facility], [place] [address] [place], [place] [address]    Report Status PENDING  Incomplete    IMAGING RESULTS: CXr b/l pleural effuisons I have personally reviewed the films ? Impression/Recommendation ? ?enterococcus bacteremia with unclear source With weakness, fatigue, anemia- need to r/o endocarditis UA shows blood as well Also afib since last admisison 2d echo done on 10/12/19- shows mod MR, moderate TR   CHF Afib Frailty  Pt is very ill with poor prognosis She is now comfort care ? ___________________________________________________ Discussed with  requesting provider Note:  This document was prepared using Dragon voice recognition software and may include unintentional dictation errors.

## 2019-11-20 DEATH — deceased
